# Patient Record
Sex: Female | Born: 1955 | Hispanic: No | Marital: Married | Smoking: Never smoker
Health system: Southern US, Community
[De-identification: ages and names within clinical notes are randomized; demographics above are authoritative.]

## PROBLEM LIST (undated history)

## (undated) DIAGNOSIS — E7800 Pure hypercholesterolemia, unspecified: Secondary | ICD-10-CM

## (undated) DIAGNOSIS — K219 Gastro-esophageal reflux disease without esophagitis: Secondary | ICD-10-CM

## (undated) DIAGNOSIS — Z8719 Personal history of other diseases of the digestive system: Secondary | ICD-10-CM

## (undated) DIAGNOSIS — Z8614 Personal history of Methicillin resistant Staphylococcus aureus infection: Secondary | ICD-10-CM

## (undated) DIAGNOSIS — R011 Cardiac murmur, unspecified: Secondary | ICD-10-CM

## (undated) HISTORY — PX: EYE SURGERY: SHX253

## (undated) HISTORY — PX: COLONOSCOPY: SHX174

---

## 2004-10-26 ENCOUNTER — Other Ambulatory Visit: Admission: RE | Admit: 2004-10-26 | Discharge: 2004-10-26 | Payer: Self-pay | Admitting: Dermatology

## 2007-01-03 DIAGNOSIS — Z8614 Personal history of Methicillin resistant Staphylococcus aureus infection: Secondary | ICD-10-CM

## 2007-01-03 HISTORY — DX: Personal history of Methicillin resistant Staphylococcus aureus infection: Z86.14

## 2017-01-23 ENCOUNTER — Other Ambulatory Visit: Payer: Self-pay | Admitting: Gastroenterology

## 2017-01-23 ENCOUNTER — Ambulatory Visit
Admission: RE | Admit: 2017-01-23 | Discharge: 2017-01-23 | Disposition: A | Discharge status: Home / Self Care | Payer: BLUE CROSS/BLUE SHIELD | Source: Ambulatory Visit | Attending: Gastroenterology | Admitting: Gastroenterology

## 2017-01-23 DIAGNOSIS — K6289 Other specified diseases of anus and rectum: Secondary | ICD-10-CM

## 2017-01-23 DIAGNOSIS — R1031 Right lower quadrant pain: Secondary | ICD-10-CM

## 2017-01-23 DIAGNOSIS — R1032 Left lower quadrant pain: Secondary | ICD-10-CM | POA: Diagnosis present

## 2017-01-25 ENCOUNTER — Ambulatory Visit
Admission: RE | Admit: 2017-01-25 | Discharge: 2017-01-25 | Disposition: A | Discharge status: Home / Self Care | Payer: BLUE CROSS/BLUE SHIELD | Source: Ambulatory Visit | Attending: Gastroenterology | Admitting: Gastroenterology

## 2017-01-31 ENCOUNTER — Ambulatory Visit: Admission: RE | Admit: 2017-01-31 | Payer: BLUE CROSS/BLUE SHIELD | Source: Ambulatory Visit

## 2017-02-05 ENCOUNTER — Encounter
Admission: RE | Admit: 2017-02-05 | Discharge: 2017-02-05 | Disposition: A | Discharge status: Home / Self Care | Payer: BLUE CROSS/BLUE SHIELD | Source: Ambulatory Visit | Attending: Obstetrics & Gynecology | Admitting: Obstetrics & Gynecology

## 2017-02-05 ENCOUNTER — Other Ambulatory Visit: Payer: Self-pay

## 2017-02-05 DIAGNOSIS — K219 Gastro-esophageal reflux disease without esophagitis: Secondary | ICD-10-CM | POA: Diagnosis not present

## 2017-02-05 DIAGNOSIS — Z8614 Personal history of Methicillin resistant Staphylococcus aureus infection: Secondary | ICD-10-CM | POA: Diagnosis not present

## 2017-02-05 DIAGNOSIS — Z803 Family history of malignant neoplasm of breast: Secondary | ICD-10-CM | POA: Diagnosis not present

## 2017-02-05 DIAGNOSIS — Z79899 Other long term (current) drug therapy: Secondary | ICD-10-CM | POA: Diagnosis not present

## 2017-02-05 DIAGNOSIS — R1900 Intra-abdominal and pelvic swelling, mass and lump, unspecified site: Secondary | ICD-10-CM | POA: Diagnosis present

## 2017-02-05 DIAGNOSIS — N736 Female pelvic peritoneal adhesions (postinfective): Secondary | ICD-10-CM | POA: Diagnosis not present

## 2017-02-05 DIAGNOSIS — Z79890 Hormone replacement therapy (postmenopausal): Secondary | ICD-10-CM | POA: Diagnosis not present

## 2017-02-05 DIAGNOSIS — Z800 Family history of malignant neoplasm of digestive organs: Secondary | ICD-10-CM | POA: Diagnosis not present

## 2017-02-05 DIAGNOSIS — Z801 Family history of malignant neoplasm of trachea, bronchus and lung: Secondary | ICD-10-CM | POA: Diagnosis not present

## 2017-02-05 DIAGNOSIS — Z01812 Encounter for preprocedural laboratory examination: Secondary | ICD-10-CM | POA: Diagnosis not present

## 2017-02-05 DIAGNOSIS — Z780 Asymptomatic menopausal state: Secondary | ICD-10-CM | POA: Diagnosis not present

## 2017-02-05 HISTORY — DX: Cardiac murmur, unspecified: R01.1

## 2017-02-05 HISTORY — DX: Personal history of other diseases of the digestive system: Z87.19

## 2017-02-05 HISTORY — DX: Gastro-esophageal reflux disease without esophagitis: K21.9

## 2017-02-05 HISTORY — DX: Personal history of Methicillin resistant Staphylococcus aureus infection: Z86.14

## 2017-02-05 LAB — SURGICAL PCR SCREEN
MRSA, PCR: NEGATIVE
STAPHYLOCOCCUS AUREUS: POSITIVE — AB

## 2017-02-05 NOTE — H&P (Signed)
Chief Complaint  Patient presents with  . Pelvic Mass   HPI: Allison Crane is a 62 y.o. G68P3003 female here for Pelvic Mass . She is new to our practice today, referred by: Niel Hummer presented to GI dept due to recent significant changes in her bowel habits in the last 6mos, and worseing in the last 6 weeks. She noted that she hadn't had a regular BM and all that was coming out was mucous. Prior to this she noted that she felt rectal pressure, that something was about to pop out, and grew increasingly painful. In the last few days, she resists eating solid foods, because the pain and pressure that ensues, she prefers just not to eat. She has lost 5# in as many days.  On exam and on subsequent imaging (ultrasound and CT) she was found to have a heterogeneous cystic and solid 8cm pelvic mass. It is unclear if it is arising from an ovary, as it is midline, or independent.  There is a history of colon cancer in a young sister (30s)  No known gyn cancers  Tumor markers:  Ca 125: 374 CEA: normal CA 19-9: 4394  Colonoscopy last on 08/2014 and negative. Had some abnormal paps "they froze it off" and after her 20's never had an abnormal.  No LMP recorded. Patient is postmenopausal.   She is on E/P HRT for menopausal sx.  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  Past Surgical History: has a past surgical history that includes Colonoscopy (09/10/2014) and Cesarean section. Family History: family history includes Breast cancer in her maternal grandmother; Colon cancer in her sister; Lung cancer in her maternal grandmother. Social History: reports that she has never smoked. She has never used smokeless tobacco. She reports that she does not drink alcohol or use drugs.   Allergies: is  allergic to sulfa (sulfonamide antibiotics). Medications:  Current Outpatient Medications:  . atorvastatin (LIPITOR) 10 MG tablet, Take 10 mg by mouth once daily, Disp: , Rfl:  . estradiol (ESTRACE) 2 MG tablet, Take 2 mg by mouth once daily., Disp: , Rfl:  . medroxyPROGESTERone (PROVERA) 2.5 MG tablet, Take 2.5 mg by mouth once daily., Disp: , Rfl:  . polyethylene glycol (MIRALAX) powder, Take 17 g by mouth once daily Mix in 4-8ounces of fluid prior to taking., Disp: 510 g, Rfl: 1  Review of Systems (positives per HPI): General: No fatigue or weight loss Eyes: No vision changes Ears: No hearing difficulty Respiratory: No cough or shortness of breath Cardiovascular: No chest pain, palpitations, dyspnea on exertion Gastrointestinal: As above. Genitourinary: No hematuria, dysuria, abnormal vaginal discharge, abnormal bleeding Lymphatic: No swollen lymph nodes Musculoskeletal: No muscle weakness Neurologic: No extremity weakness, syncope Psychiatric: No delusions or suicidal/homicidal ideation  Exam:  Constitutional: BP 159/77  Pulse 73  Ht 167.6 cm (5\' 6" )  Wt 69.9 kg (154 lb 3.2 oz)  BMI 24.89 kg/m   WDWN female in NAD  HEENT: sclera clear, non-icteric, moist mucous membranes, dentition intact Endocrine: no thyromegaly Respiratory: normal respiratory effort CTABL  CV: no peripheral edema RRR GI: soft , no mass, normal active bowel sounds, non-tender, no rebound tenderness GU: tanner stage 5 ,  External genitalia/skin: vulva /labia no lesions Lymphatic: no enlarged inguinal nodes bilaterally Urethra: no prolapse, no diverticulum, no caruncle Bladder:  no tenderness to palpation, no cystocele Vagina: normal physiologic d/c, no lesions, normal apical support Cervix: no lesions, no cervical motion tenderness  Uterus: ability to assess size and shape compromised by posterior mass. Anteverted or mass effect. Adnexa: no masses bilaterally, non-tender  Rectovaginal: posterior  mass, filling to the diagonal conjugate, dense and not easily moved, does not feel to be fixed but mobility is limited. , no nodularity Skin: warm and well perfused, no rashes Neuro: alert, oriented x3,  Psych: appropriate mood and insight, judgement intact Data: Reviewed: labs, results, imaging, referral records Imaging reports are printed And reviewed; CD of images to be uploaded then reviewed once in system.  The text of the radiology reports are not able to be transferred directly to this note.   CT shows no lymphadenopathy, no curious lesions in pancreas, liver, omental caking, or other pelvic process. Mass effect on ureter but no hydronephrosis. Ultrasound confirms complex mass, with solid and cystic components.   Impression:   The encounter diagnosis was Pelvic mass.  Plan:   Ms. Allison Crane is a generally healthy person, with a new finding of a pelvic mass without radiographic findings of disease throughout the abdomen. Due to the unknown nature of the mass, the recommended approach to diagnosis and treatment is surgical.  My recommendation is expedited surgical exploration with proposed removal of the mass, and appropriate concurrent surgeries pending its origin.  Due to the lack of intraabdominal findings on imaging, a minimally invasive approach is reasonable to attempt. She understands that my suspicion of cancer is present, and prominent, and that it may be necessary to convert to an open procedure for adequate manipulation of the mass and/or exploration of other structures.  She was also advised that if this were a benign ovarian tumor, that removal of the uterus would not be necessary, however she elected to proceed with a single surgery to remove all of her reproductive organs: uterus, cervix, tubes and ovaries.  The patient and I discussed the technical aspects of the procedure including the potential for risks and complications.These include but are not limited to the risk of  infection requiring post-operative antibiotics or further procedures.We talked about the risk of injury to adjacent organs including bladder, bowel, ureter, blood vessels or nerves, the need to convert to an open incision, possibleneed for blood transfusion andpostop complications such asthromboembolic or cardiopulmonary complications.All of her questions were answered. Her preoperative exam was completed andthe appropriate consents were signed. She is scheduled to undergo this procedure in the near future.  Proposed surgery: Total laparoscopic hysterectomy, bilateral salpingo-oophorectomy, removal of pelvic mass, and possible laparotomy, possible pelvic and para-aortic lymphadenectomy.  I have been in contact with Dr. Johnnette LitterBerchuck regarding assisting me with this surgery and he is willing. She is tentatively scheduled for Feb 6, next Wednesday.  CHELSEA CRIST WARD, MD

## 2017-02-05 NOTE — Patient Instructions (Signed)
  [facility] 2nd floor medical mall [facility](Medical Mall Entrance-take elevator on left to 2nd floor.  Check in with surgery information desk.) To find out your arrival time please call [phone] between 1PM - 3PM on 02-06-17 TUESDAY  Remember: Instructions that are not followed completely may result in serious medical risk, up to and including death, or upon the discretion of your surgeon and anesthesiologist your surgery may need to be rescheduled.    _x___ 1. Do not eat food after midnight the night before your procedure. NO GUM OR CANDY AFTER MIDNIGHT.  You may drink clear liquids up to 2 hours before you are scheduled to arrive at the hospital for your procedure.  Do not drink clear liquids within 2 hours of your scheduled arrival to the hospital.  Clear liquids include  --Water or Apple juice without pulp  --Clear carbohydrate beverage such as ClearFast or Gatorade  --Black Coffee or Clear Tea (No milk, no creamers, do not add anything to the coffee or Tea     __x__ 2. No Alcohol for 24 hours before or after surgery.   __x__3. No Smoking for 24 prior to surgery.   ____  4. Bring all medications with you on the day of surgery if instructed.    __x__ 5. Notify your doctor if there is any change in your medical condition     (cold, fever, infections).     Do not wear jewelry, make-up, hairpins, clips or nail polish.  Do not wear lotions, powders, or perfumes. You may wear deodorant.  Do not shave 48 hours prior to surgery. Men may shave face and neck.  Do not bring valuables to the hospital.    [facility] not [facility] any belongings or valuables.               Contacts, dentures or bridgework may not be worn into surgery.  Leave your suitcase in the car. After surgery it may be brought to your room.  For patients admitted to the hospital, discharge time is determined by your treatment team.   Patients  discharged the day of surgery will not be allowed to drive home.  You will need someone to drive you home and stay with you the night of your procedure.    Please read over the following fact sheets that you were given:   [facility] Pre[facility] and or MRSA Information   _x___ TAKE THE FOLLOWING MEDICATION THE MORNING OF SURGERY WITH A SMALL SIP OF WATER. These include:  1. LIPITOR (ATROVASTATIN)  2.  3.  4.  5.  6.  ____Fleets enema or Magnesium Citrate as directed.   _x___ Use CHG Soap or sage wipes as directed on instruction sheet   ____ Use inhalers on the day of surgery and bring to hospital day of surgery  ____ Stop Metformin and Janumet 2 days prior to surgery.    ____ Take 1/2 of usual insulin dose the night before surgery and none on the morning surgery.   ____ Follow recommendations from Cardiologist, Pulmonologist or PCP regarding  stopping Aspirin, Coumadin, Plavix ,Eliquis, Effient, or Pradaxa, and Pletal.  X____Stop Anti-inflammatories such as Advil, Aleve, Ibuprofen, Motrin, Naproxen, Naprosyn, Goodies powders or aspirin products NOW-OK to take Tylenol    ____ Stop supplements until after surgery.   ____ Bring C-Pap to the hospital.

## 2017-02-06 ENCOUNTER — Encounter: Payer: Self-pay | Admitting: *Deleted

## 2017-02-06 MED ORDER — CEFAZOLIN SODIUM-DEXTROSE 2-4 GM/100ML-% IV SOLN
2.0000 g | INTRAVENOUS | Status: AC
  Administered 2017-02-07: 2 g via INTRAVENOUS

## 2017-02-07 ENCOUNTER — Encounter: Payer: Self-pay | Admitting: *Deleted

## 2017-02-07 ENCOUNTER — Encounter
Admission: RE | Disposition: A | Discharge status: Home / Self Care | Payer: Self-pay | Source: Ambulatory Visit | Attending: Obstetrics & Gynecology

## 2017-02-07 ENCOUNTER — Ambulatory Visit
Admission: RE | Admit: 2017-02-07 | Discharge: 2017-02-07 | Disposition: A | Discharge status: Home / Self Care | Payer: BLUE CROSS/BLUE SHIELD | Source: Ambulatory Visit | Attending: Obstetrics & Gynecology | Admitting: Obstetrics & Gynecology

## 2017-02-07 ENCOUNTER — Inpatient Hospital Stay: Payer: BLUE CROSS/BLUE SHIELD | Admitting: Anesthesiology

## 2017-02-07 DIAGNOSIS — Z79890 Hormone replacement therapy (postmenopausal): Secondary | ICD-10-CM | POA: Insufficient documentation

## 2017-02-07 DIAGNOSIS — Z79899 Other long term (current) drug therapy: Secondary | ICD-10-CM | POA: Insufficient documentation

## 2017-02-07 DIAGNOSIS — N736 Female pelvic peritoneal adhesions (postinfective): Secondary | ICD-10-CM | POA: Insufficient documentation

## 2017-02-07 DIAGNOSIS — Z8614 Personal history of Methicillin resistant Staphylococcus aureus infection: Secondary | ICD-10-CM | POA: Insufficient documentation

## 2017-02-07 DIAGNOSIS — Z780 Asymptomatic menopausal state: Secondary | ICD-10-CM | POA: Insufficient documentation

## 2017-02-07 DIAGNOSIS — Z801 Family history of malignant neoplasm of trachea, bronchus and lung: Secondary | ICD-10-CM | POA: Insufficient documentation

## 2017-02-07 DIAGNOSIS — K219 Gastro-esophageal reflux disease without esophagitis: Secondary | ICD-10-CM | POA: Insufficient documentation

## 2017-02-07 DIAGNOSIS — Z800 Family history of malignant neoplasm of digestive organs: Secondary | ICD-10-CM | POA: Insufficient documentation

## 2017-02-07 DIAGNOSIS — R1900 Intra-abdominal and pelvic swelling, mass and lump, unspecified site: Secondary | ICD-10-CM | POA: Diagnosis not present

## 2017-02-07 DIAGNOSIS — Z803 Family history of malignant neoplasm of breast: Secondary | ICD-10-CM | POA: Insufficient documentation

## 2017-02-07 DIAGNOSIS — Z01812 Encounter for preprocedural laboratory examination: Secondary | ICD-10-CM | POA: Insufficient documentation

## 2017-02-07 HISTORY — PX: LAPAROSCOPIC BILATERAL SALPINGECTOMY: SHX5889

## 2017-02-07 HISTORY — PX: LAPAROSCOPIC HYSTERECTOMY: SHX1926

## 2017-02-07 HISTORY — DX: Pure hypercholesterolemia, unspecified: E78.00

## 2017-02-07 HISTORY — PX: LAPAROTOMY: SHX154

## 2017-02-07 LAB — ABO/RH: ABO/RH(D): AB POS

## 2017-02-07 SURGERY — HYSTERECTOMY, TOTAL, LAPAROSCOPIC
Anesthesia: General

## 2017-02-07 MED ORDER — ROCURONIUM BROMIDE 100 MG/10ML IV SOLN
INTRAVENOUS | Status: DC | PRN
  Administered 2017-02-07: 50 mg via INTRAVENOUS

## 2017-02-07 MED ORDER — FAMOTIDINE 20 MG PO TABS
ORAL_TABLET | ORAL | Status: AC
  Administered 2017-02-07: 20 mg via ORAL
  Filled 2017-02-07: qty 1

## 2017-02-07 MED ORDER — OXYCODONE HCL 5 MG/5ML PO SOLN: 5.0000 mg | Freq: Once | ORAL | Status: DC | PRN

## 2017-02-07 MED ORDER — CEFAZOLIN SODIUM-DEXTROSE 2-4 GM/100ML-% IV SOLN
INTRAVENOUS | Status: AC
Start: 2017-02-07 — End: 2017-02-07
  Filled 2017-02-07: qty 100

## 2017-02-07 MED ORDER — FENTANYL CITRATE (PF) 100 MCG/2ML IJ SOLN
INTRAMUSCULAR | Status: DC | PRN
  Administered 2017-02-07 (×4): 50 ug via INTRAVENOUS

## 2017-02-07 MED ORDER — KETOROLAC TROMETHAMINE 30 MG/ML IJ SOLN
30.0000 mg | Freq: Four times a day (QID) | INTRAMUSCULAR | Status: DC
  Administered 2017-02-07: 30 mg via INTRAVENOUS
  Filled 2017-02-07: qty 1

## 2017-02-07 MED ORDER — SCOPOLAMINE 1 MG/3DAYS TD PT72
MEDICATED_PATCH | TRANSDERMAL | Status: AC
  Administered 2017-02-07: 1.5 mg via TRANSDERMAL
  Filled 2017-02-07: qty 1

## 2017-02-07 MED ORDER — DEXAMETHASONE SODIUM PHOSPHATE 10 MG/ML IJ SOLN
INTRAMUSCULAR | Status: AC
  Filled 2017-02-07: qty 1

## 2017-02-07 MED ORDER — MIDAZOLAM HCL 2 MG/2ML IJ SOLN
INTRAMUSCULAR | Status: AC
  Filled 2017-02-07: qty 2

## 2017-02-07 MED ORDER — ROCURONIUM BROMIDE 50 MG/5ML IV SOLN
INTRAVENOUS | Status: AC
  Filled 2017-02-07: qty 1

## 2017-02-07 MED ORDER — LIDOCAINE HCL (CARDIAC) 20 MG/ML IV SOLN
INTRAVENOUS | Status: DC | PRN
  Administered 2017-02-07: 100 mg via INTRAVENOUS

## 2017-02-07 MED ORDER — LACTATED RINGERS IV SOLN
INTRAVENOUS | Status: DC
  Administered 2017-02-07 (×2): via INTRAVENOUS

## 2017-02-07 MED ORDER — MIDAZOLAM HCL 2 MG/2ML IJ SOLN
INTRAMUSCULAR | Status: DC | PRN
  Administered 2017-02-07: 2 mg via INTRAVENOUS

## 2017-02-07 MED ORDER — SUGAMMADEX SODIUM 200 MG/2ML IV SOLN
INTRAVENOUS | Status: AC
  Filled 2017-02-07: qty 2

## 2017-02-07 MED ORDER — MEPERIDINE HCL 50 MG/ML IJ SOLN: 6.2500 mg | INTRAMUSCULAR | Status: DC | PRN

## 2017-02-07 MED ORDER — IBUPROFEN 800 MG PO TABS: 800.0000 mg | ORAL_TABLET | Freq: Four times a day (QID) | ORAL | 0 refills | Status: AC

## 2017-02-07 MED ORDER — ONDANSETRON HCL 4 MG/2ML IJ SOLN
INTRAMUSCULAR | Status: DC | PRN
  Administered 2017-02-07: 4 mg via INTRAVENOUS

## 2017-02-07 MED ORDER — PROMETHAZINE HCL 25 MG/ML IJ SOLN
6.2500 mg | INTRAMUSCULAR | Status: DC | PRN
  Administered 2017-02-07: 6.25 mg via INTRAVENOUS

## 2017-02-07 MED ORDER — DEXAMETHASONE SODIUM PHOSPHATE 10 MG/ML IJ SOLN
INTRAMUSCULAR | Status: DC | PRN
  Administered 2017-02-07: 5 mg via INTRAVENOUS

## 2017-02-07 MED ORDER — GABAPENTIN 300 MG PO CAPS
ORAL_CAPSULE | ORAL | Status: AC
  Administered 2017-02-07: 600 mg via ORAL
  Filled 2017-02-07: qty 2

## 2017-02-07 MED ORDER — ENOXAPARIN SODIUM 40 MG/0.4ML ~~LOC~~ SOLN
40.0000 mg | SUBCUTANEOUS | Status: AC
  Administered 2017-02-07: 40 mg via SUBCUTANEOUS
  Filled 2017-02-07: qty 0.4

## 2017-02-07 MED ORDER — CELECOXIB 200 MG PO CAPS
ORAL_CAPSULE | ORAL | Status: AC
  Administered 2017-02-07: 400 mg via ORAL
  Filled 2017-02-07: qty 2

## 2017-02-07 MED ORDER — FENTANYL CITRATE (PF) 100 MCG/2ML IJ SOLN
INTRAMUSCULAR | Status: AC
  Filled 2017-02-07: qty 2

## 2017-02-07 MED ORDER — PROPOFOL 10 MG/ML IV BOLUS
INTRAVENOUS | Status: DC | PRN
  Administered 2017-02-07: 180 mg via INTRAVENOUS

## 2017-02-07 MED ORDER — KETOROLAC TROMETHAMINE 30 MG/ML IJ SOLN
INTRAMUSCULAR | Status: AC
  Administered 2017-02-07: 30 mg via INTRAVENOUS
  Filled 2017-02-07: qty 1

## 2017-02-07 MED ORDER — ACETAMINOPHEN 500 MG PO TABS
1000.0000 mg | ORAL_TABLET | ORAL | Status: AC
  Administered 2017-02-07: 1000 mg via ORAL

## 2017-02-07 MED ORDER — ACETAMINOPHEN 500 MG PO TABS
ORAL_TABLET | ORAL | Status: AC
  Administered 2017-02-07: 1000 mg via ORAL
  Filled 2017-02-07: qty 2

## 2017-02-07 MED ORDER — PROPOFOL 10 MG/ML IV BOLUS
INTRAVENOUS | Status: AC
  Filled 2017-02-07: qty 20

## 2017-02-07 MED ORDER — GABAPENTIN 300 MG PO CAPS
600.0000 mg | ORAL_CAPSULE | ORAL | Status: AC
  Administered 2017-02-07: 600 mg via ORAL

## 2017-02-07 MED ORDER — LIDOCAINE HCL (PF) 2 % IJ SOLN
INTRAMUSCULAR | Status: AC
  Filled 2017-02-07: qty 10

## 2017-02-07 MED ORDER — PHENYLEPHRINE HCL 10 MG/ML IJ SOLN
INTRAMUSCULAR | Status: DC | PRN
  Administered 2017-02-07: 50 ug via INTRAVENOUS

## 2017-02-07 MED ORDER — FAMOTIDINE 20 MG PO TABS
20.0000 mg | ORAL_TABLET | Freq: Once | ORAL | Status: AC
  Administered 2017-02-07: 20 mg via ORAL

## 2017-02-07 MED ORDER — OXYCODONE HCL 5 MG PO TABS: 5.0000 mg | ORAL_TABLET | Freq: Once | ORAL | Status: DC | PRN

## 2017-02-07 MED ORDER — SUGAMMADEX SODIUM 200 MG/2ML IV SOLN
INTRAVENOUS | Status: DC | PRN
  Administered 2017-02-07: 200 mg via INTRAVENOUS

## 2017-02-07 MED ORDER — ONDANSETRON HCL 4 MG/2ML IJ SOLN
INTRAMUSCULAR | Status: AC
  Filled 2017-02-07: qty 2

## 2017-02-07 MED ORDER — SCOPOLAMINE 1 MG/3DAYS TD PT72
1.0000 | MEDICATED_PATCH | TRANSDERMAL | Status: DC
  Administered 2017-02-07: 1.5 mg via TRANSDERMAL

## 2017-02-07 MED ORDER — FENTANYL CITRATE (PF) 100 MCG/2ML IJ SOLN
INTRAMUSCULAR | Status: AC
  Administered 2017-02-07: 25 ug via INTRAVENOUS
  Filled 2017-02-07: qty 2

## 2017-02-07 MED ORDER — PROMETHAZINE HCL 25 MG/ML IJ SOLN
INTRAMUSCULAR | Status: AC
  Administered 2017-02-07: 6.25 mg via INTRAVENOUS
  Filled 2017-02-07: qty 1

## 2017-02-07 MED ORDER — OXYCODONE HCL 5 MG PO TABS: 5.0000 mg | ORAL_TABLET | ORAL | 0 refills | Status: AC | PRN

## 2017-02-07 MED ORDER — CELECOXIB 200 MG PO CAPS
400.0000 mg | ORAL_CAPSULE | ORAL | Status: AC
  Administered 2017-02-07: 400 mg via ORAL

## 2017-02-07 MED ORDER — FENTANYL CITRATE (PF) 100 MCG/2ML IJ SOLN
25.0000 ug | INTRAMUSCULAR | Status: DC | PRN
  Administered 2017-02-07 (×2): 25 ug via INTRAVENOUS

## 2017-02-07 MED ORDER — DEXAMETHASONE SODIUM PHOSPHATE 4 MG/ML IJ SOLN
4.0000 mg | INTRAMUSCULAR | Status: AC
  Administered 2017-02-07: 4 mg via INTRAVENOUS
  Filled 2017-02-07: qty 1

## 2017-02-07 MED ORDER — SODIUM CHLORIDE 0.9 % IJ SOLN
INTRAMUSCULAR | Status: AC
  Filled 2017-02-07: qty 10

## 2017-02-07 SURGICAL SUPPLY — 97 items
ADH SKN CLS APL DERMABOND .7 (GAUZE/BANDAGES/DRESSINGS) ×2
AGENT HMST MTR 8 SURGIFLO (HEMOSTASIS)
APL ESCP 34 STRL LF DISP (HEMOSTASIS)
APPLICATOR SURGIFLO ENDO (HEMOSTASIS) IMPLANT
BAG SPEC RTRVL LRG 6X4 10 (ENDOMECHANICALS) ×2
BAG URINE DRAINAGE (UROLOGICAL SUPPLIES) ×4 IMPLANT
BLADE SURG 15 STRL LF DISP TIS (BLADE) ×2 IMPLANT
BLADE SURG 15 STRL SS (BLADE) ×4
CANISTER SUCT 1200ML W/VALVE (MISCELLANEOUS) ×4 IMPLANT
CANNULA DILATOR  5MM W/SLV (CANNULA) ×1
CANNULA DILATOR 5 W/SLV (CANNULA) ×3 IMPLANT
CATH FOLEY 2WAY  5CC 16FR (CATHETERS) ×2
CATH FOLEY 2WAY 5CC 16FR (CATHETERS) ×2
CATH URTH 16FR FL 2W BLN LF (CATHETERS) ×2 IMPLANT
CHLORAPREP W/TINT 26ML (MISCELLANEOUS) ×4 IMPLANT
CNTNR SPEC 2.5X3XGRAD LEK (MISCELLANEOUS)
CONT SPEC 4OZ STER OR WHT (MISCELLANEOUS)
CONT SPEC 4OZ STRL OR WHT (MISCELLANEOUS)
CONTAINER SPEC 2.5X3XGRAD LEK (MISCELLANEOUS) ×2 IMPLANT
CORD MONOPOLAR M/FML 12FT (MISCELLANEOUS) ×4 IMPLANT
DEFOGGER SCOPE WARMER CLEARIFY (MISCELLANEOUS) ×4 IMPLANT
DERMABOND ADVANCED (GAUZE/BANDAGES/DRESSINGS) ×2
DERMABOND ADVANCED .7 DNX12 (GAUZE/BANDAGES/DRESSINGS) ×2 IMPLANT
DEVICE PMI PUNCTURE CLOSURE (MISCELLANEOUS) ×2 IMPLANT
DEVICE SUTURE ENDOST 10MM (ENDOMECHANICALS) ×4 IMPLANT
DRAPE LAP W/FLUID (DRAPES) ×4 IMPLANT
DRAPE LAPAROTOMY 100X77 ABD (DRAPES) ×2 IMPLANT
DRAPE LAPAROTOMY TRNSV 106X77 (MISCELLANEOUS) ×2 IMPLANT
DRAPE PERI LITHO V/GYN (MISCELLANEOUS) ×2 IMPLANT
DRAPE STERI POUCH LG 24X46 STR (DRAPES) ×8 IMPLANT
DRAPE UNDER BUTTOCK W/FLU (DRAPES) ×4 IMPLANT
DRSG TELFA 3X8 NADH (GAUZE/BANDAGES/DRESSINGS) IMPLANT
ELECT BLADE 6 FLAT ULTRCLN (ELECTRODE) ×2 IMPLANT
ELECT CAUTERY BLADE 6.4 (BLADE) ×2 IMPLANT
ELECT REM PT RETURN 9FT ADLT (ELECTROSURGICAL) ×4
ELECTRODE REM PT RTRN 9FT ADLT (ELECTROSURGICAL) ×2 IMPLANT
GAUZE SPONGE 4X4 12PLY STRL (GAUZE/BANDAGES/DRESSINGS) ×2 IMPLANT
GLOVE BIO SURGEON STRL SZ8 (GLOVE) ×18 IMPLANT
GLOVE INDICATOR 8.0 STRL GRN (GLOVE) ×12 IMPLANT
GOWN STRL REUS W/ TWL LRG LVL3 (GOWN DISPOSABLE) ×4 IMPLANT
GOWN STRL REUS W/ TWL XL LVL3 (GOWN DISPOSABLE) ×2 IMPLANT
GOWN STRL REUS W/TWL LRG LVL3 (GOWN DISPOSABLE) ×8
GOWN STRL REUS W/TWL XL LVL3 (GOWN DISPOSABLE) ×4
GRASPER ENDO ROTC 5X36 (INSTRUMENTS) IMPLANT
IRRIGATION STRYKERFLOW (MISCELLANEOUS) IMPLANT
IRRIGATOR STRYKERFLOW (MISCELLANEOUS) ×4
IV LACTATED RINGERS 1000ML (IV SOLUTION) ×4 IMPLANT
KIT PINK PAD W/HEAD ARE REST (MISCELLANEOUS) ×4
KIT PINK PAD W/HEAD ARM REST (MISCELLANEOUS) ×2 IMPLANT
KIT TURNOVER CYSTO (KITS) ×4 IMPLANT
LABEL OR SOLS (LABEL) ×2 IMPLANT
LIGASURE IMPACT 36 18CM CVD LR (INSTRUMENTS) ×2 IMPLANT
LIGASURE VESSEL 5MM BLUNT TIP (ELECTROSURGICAL) ×4 IMPLANT
MANIPULATOR VCARE LG CRV RETR (MISCELLANEOUS) IMPLANT
MANIPULATOR VCARE SML CRV RETR (MISCELLANEOUS) IMPLANT
MANIPULATOR VCARE STD CRV RETR (MISCELLANEOUS) ×2 IMPLANT
NDL INSUFF ACCESS 14 VERSASTEP (NEEDLE) ×4 IMPLANT
NDL SPNL 22GX5 LNG QUINC BK (NEEDLE) ×2 IMPLANT
NEEDLE SPNL 22GX5 LNG QUINC BK (NEEDLE) ×4 IMPLANT
NS IRRIG 1000ML POUR BTL (IV SOLUTION) ×2 IMPLANT
NS IRRIG 500ML POUR BTL (IV SOLUTION) ×4 IMPLANT
OCCLUDER COLPOPNEUMO (BALLOONS) ×4 IMPLANT
PACK BASIN MAJOR ARMC (MISCELLANEOUS) ×2 IMPLANT
PACK GYN LAPAROSCOPIC (MISCELLANEOUS) ×4 IMPLANT
PAD DRESSING TELFA 3X8 NADH (GAUZE/BANDAGES/DRESSINGS) ×2 IMPLANT
PAD OB MATERNITY 4.3X12.25 (PERSONAL CARE ITEMS) ×4 IMPLANT
PAD PREP 24X41 OB/GYN DISP (PERSONAL CARE ITEMS) ×4 IMPLANT
POUCH ENDO CATCH II 15MM (MISCELLANEOUS) IMPLANT
POUCH SPECIMEN RETRIEVAL 10MM (ENDOMECHANICALS) ×2 IMPLANT
SET CYSTO W/LG BORE CLAMP LF (SET/KITS/TRAYS/PACK) ×4 IMPLANT
SHEARS ENDO 5MM 31CM (CUTTER) ×2 IMPLANT
SPOGE SURGIFLO 8M (HEMOSTASIS)
SPONGE LAP 18X18 5 PK (GAUZE/BANDAGES/DRESSINGS) ×2 IMPLANT
SPONGE LAP 4X18 5PK (MISCELLANEOUS) IMPLANT
SPONGE SURGIFLO 8M (HEMOSTASIS) IMPLANT
STAPLER SKIN PROX 35W (STAPLE) ×2 IMPLANT
SUT ENDO VLOC 180-0-8IN (SUTURE) ×4 IMPLANT
SUT MAXON ABS #0 GS21 30IN (SUTURE) ×4 IMPLANT
SUT MNCRL 4-0 (SUTURE) ×8
SUT MNCRL 4-0 27XMFL (SUTURE) ×4
SUT PDS AB 1 TP1 96 (SUTURE) ×4 IMPLANT
SUT PLAIN 2 0 XLH (SUTURE) ×2 IMPLANT
SUT VIC AB 0 CT1 27 (SUTURE)
SUT VIC AB 0 CT1 27XCR 8 STRN (SUTURE) ×8 IMPLANT
SUT VIC AB 0 CT1 36 (SUTURE) ×8 IMPLANT
SUT VIC AB 2-0 UR6 27 (SUTURE) ×6 IMPLANT
SUT VICRYL AB 3-0 FS1 BRD 27IN (SUTURE) ×2 IMPLANT
SUTURE MNCRL 4-0 27XMF (SUTURE) IMPLANT
SYR 10ML LL (SYRINGE) ×4 IMPLANT
SYR 3ML LL SCALE MARK (SYRINGE) ×8 IMPLANT
SYR 50ML LL SCALE MARK (SYRINGE) ×8 IMPLANT
SYR BULB IRRIG 60ML STRL (SYRINGE) ×2 IMPLANT
TRAY FOLEY W/METER SILVER 16FR (SET/KITS/TRAYS/PACK) ×2 IMPLANT
TROCAR BLUNT TIP 12MM OMST12BT (TROCAR) ×4 IMPLANT
TROCAR VERSASTEP PLUS 12MM (TROCAR) ×4 IMPLANT
TROCAR VERSASTEP PLUS 5MM (TROCAR) ×8 IMPLANT
TUBING INSUF HEATED (TUBING) ×4 IMPLANT

## 2017-02-07 NOTE — Transfer of Care (Signed)
Immediate Anesthesia Transfer of Care Note  Patient: Allison Crane  Procedure(s) Performed: HYSTERECTOMY TOTAL LAPAROSCOPIC (N/A ) LAPAROSCOPIC BILATERAL SALPINGECTOMY (Bilateral ) LAPAROTOMY (N/A )  Patient Location: PACU  Anesthesia Type:General  Level of Consciousness: sedated  Airway & Oxygen Therapy: Patient Spontanous Breathing  Post-op Assessment: Report given to RN  Post vital signs: Reviewed and stable  Last Vitals:  Vitals:   02/07/17 0703  BP: (!) 152/67  Pulse: 60  Resp: 18  Temp: (!) 36.2 C  SpO2: 100%    Last Pain:  Vitals:   02/07/17 0703  TempSrc: Tympanic      Patients Stated Pain Goal: 0 (02/07/17 0703)  Complications: No apparent anesthesia complications

## 2017-02-07 NOTE — Anesthesia Postprocedure Evaluation (Signed)
Anesthesia Post Note  Patient: Audree CamelKathy Cavazos  Procedure(s) Performed: HYSTERECTOMY TOTAL LAPAROSCOPIC (N/A ) LAPAROSCOPIC BILATERAL SALPINGECTOMY (Bilateral ) LAPAROTOMY (N/A )  Patient location during evaluation: PACU Anesthesia Type: General Level of consciousness: awake and alert and oriented Pain management: pain level controlled Vital Signs Assessment: post-procedure vital signs reviewed and stable Respiratory status: spontaneous breathing, nonlabored ventilation and respiratory function stable Cardiovascular status: blood pressure returned to baseline and stable Postop Assessment: no signs of nausea or vomiting Anesthetic complications: no     Last Vitals:  Vitals:   02/07/17 1153 02/07/17 1217  BP:  130/61  Pulse: 77 87  Resp: 19 18  Temp: 36.6 C 36.6 C  SpO2: 95% 96%    Last Pain:  Vitals:   02/07/17 1217  TempSrc: Temporal  PainSc: 6                  Angeli Demilio

## 2017-02-07 NOTE — Anesthesia Procedure Notes (Signed)
Performed by: Ermine Spofford E, CRNA       

## 2017-02-07 NOTE — Interval H&P Note (Signed)
History and Physical Interval Note:  02/07/2017 7:52 AM  Allison Crane  has presented today for surgery, with the diagnosis of pelvic mass  The various methods of treatment have been discussed with the patient and family. After consideration of risks, benefits and other options for treatment, the patient has consented to  Procedure(s): HYSTERECTOMY TOTAL LAPAROSCOPIC (N/A) LAPAROSCOPIC BILATERAL SALPINGECTOMY (Bilateral) possible laparotomy, possible pelvic and para-aortic lymphadenectomy, as a surgical intervention .  The patient's history has been reviewed, patient examined, no change in status, stable for surgery.  I have reviewed the patient's chart and labs.  Questions were answered to the patient's satisfaction.    BP (!) 152/67   Pulse 60   Temp (!) 97.2 F (36.2 C) (Tympanic)   Resp 18   Ht 5\' 6"  (1.676 m)   Wt 69.9 kg (154 lb)   LMP 02/05/2014   SpO2 100%   BMI 24.86 kg/m    [facility] Ward

## 2017-02-07 NOTE — Anesthesia Procedure Notes (Signed)
Procedure Name: Intubation Date/Time: 02/07/2017 8:08 AM Performed by: Danelle BerryWarr, Shunda Rabadi E, CRNA Pre-anesthesia Checklist: Patient identified, Emergency Drugs available, Suction available, Patient being monitored and Timeout performed Patient Re-evaluated:Patient Re-evaluated prior to induction Oxygen Delivery Method: Circle system utilized and Simple face mask Preoxygenation: Pre-oxygenation with 100% oxygen Induction Type: IV induction Ventilation: Mask ventilation without difficulty Laryngoscope Size: McGraph and 3 Grade View: Grade III Tube type: Oral Tube size: 7.0 mm Number of attempts: 2 Airway Equipment and Method: Stylet and Video-laryngoscopy Placement Confirmation: ETT inserted through vocal cords under direct vision Secured at: 20 cm Tube secured with: Tape Dental Injury: Teeth and Oropharynx as per pre-operative assessment  Difficulty Due To: Difficulty was unanticipated

## 2017-02-07 NOTE — Op Note (Signed)
Total Laparoscopic Hysterectomy Operative Note Procedure Date: 02/07/2017  Patient:  Allison Crane  62 y.o. female  PRE-OPERATIVE DIAGNOSIS:  pelvic mass  POST-OPERATIVE DIAGNOSIS:  Pelvic inclusion cyst  PROCEDURE:  Procedure(s): HYSTERECTOMY TOTAL LAPAROSCOPIC (N/A) LAPAROSCOPIC BILATERAL SALPINGECTOMY (Bilateral) Extensive lysis of adhesions (>26mins) Proctoscopy, sigmoidoscopy  SURGEON:  Surgeon(s) and Role:    * Neila Teem, Elenora Fender, MD - Primary Assisted by Dr. Leida Lauth  ANESTHESIA:  General via ET  I/O  Total I/O In: 1500 [I.V.:1500] Out: 300 [Urine:200; Blood:100]  FINDINGS:  Small uterus, scarred and edematous bilateral adnexa, normal right ovary, left ovary not visualized.  Bilateral vermiculating ureters. Omental adhesion to anterior abdominal wall from umbilicus to pelvic brim.  Normal liver and diaphragm.   Adhesions of the omentum to right fundus of uterus Adhesion of proximal and redundant sigmoid colon to the posterior culdesac, with a rind of tissue adherent to both the colon and the peritoneum.   Superior to the sigmoid/rectum and inferior to the right adnexa/posterior broad ligament was a gray fibrous collection of adherent tissue which was able to be ligated and peeled off and sent for frozen pathology along with uterus, cervix, tubes and ovaries.  SPECIMEN: Uterus, Cervix, and bilateral tubes and ovaries, posterior culdesac mass. Pelvic washings.  FROZEN PATHOLOGY: benign   COMPLICATIONS: none apparent  DISPOSITION: vital signs stable to PACU  Indication for Surgery: 62 y.o. G3P3 with recent onset change in bowel habits, with CT and Ultrasound showing 8cm pelvic mass with solid and cystic components; CA 125 374 and CA 19-9 4394, CEA normal.  Risks of surgery were discussed with the patient including but not limited to: bleeding which may require transfusion or reoperation; infection which may require antibiotics; injury to bowel, bladder, ureters or other  surrounding organs; need for additional procedures including laparotomy, blood clot, incisional problems and other postoperative/anesthesia complications. Written informed consent was obtained.     PROCEDURE IN DETAIL:  The patient had 40mg  Lovenox SQ and sequential compression devices applied to her lower extremities while in the preoperative area.  She was then taken to the operating room. IV antibiotics were given. General anesthesia was administered via endotracheal route.  She was placed in the dorsal lithotomy position, and was prepped and draped in a sterile manner. A surgical time-out was performed.  A Foley catheter was inserted into her bladder and attached to constant drainage and a V-Care uterine manipulator was then advanced into the uterus and a good fit around the cervix was noted. The gloves were changed, and attention was turned to the abdomen where an umbilical incision was made with the scalpel.  An open Roseanne Reno technique was used to develop the incision through the fascia, and a 15mm balloon trochar was inserted in the umbilical incision.Opening pressure was , and the abdomen was insufflated to carbon dioxide gas and adequate pneumoperitoneum was obtained. A survey of the patient's pelvis and abdomen revealed the findings as mentioned above. Two 5mm ports were inserted in the lower left and right quadrants under visualization, and the endoshears were used to transect the omental adhesion from the anterior abdominal wall. A fourth, 11mm trochar was inserted suprapubically.     Pelvic washings were obtained.  The omental adhesion at the fundus of the uterus was transected.  The Sigmoid adhesion to the posterior culdesac was both bluntly and sharply dissected from the peritoneal surface, leaving behind a rind of tissue on both surfaces and without apparent injury to the sigmoid.  The bilateral round  ligaments were transected.  The bilateral peritoneum along the IP ligaments were  divided.  The bilateral ureters were visualized vermiculating and away from the IP ligaments.  The bilateral IP ligaments were thrice cauterized and transected.  The anterior broad ligament divided and brought across the uterus to separate the vesicouterine peritoneum and create a bladder flap.  The bladder was pushed away from the uterus. The bilateral uterine arteries were skeletonized, ligated and transected. The bilateral uterosacral and cardinal ligaments were ligated and transected. A colpotomy was made around the V-Care cervical cup and the uterus, cervix, bilateral tubes and ovaries were removed through the vagina. The vaginal cuff was closed vaginally using V-Lock suture with the Endostitch. The pneumoperitoneum was deflated and recreated and surgical site inspected, and found to be hemostatic. Bilateral ureters were visualized vermiuclating. No intraoperative injury to surrounding organs was noted.   The rigid sigmoidoscope was inserted into the anus and through the rectum.  Suction was applied to allow visualization.  There was no apparent diverticulum nor injury to the mucosa of the sigmoid.  The abdomen was filled with fluid and a bubble test was negative.   Each of the four trochar sites were closed with 2-0 vicryl with the inlet closure device.  The abdomen was desufflated and all instruments were then removed. All skin incisions were closed with 4-0 monocryl and covered with surgical glue. The foley catheter was removed.  The patient tolerated the procedures well.  All instruments, needles, and sponge counts were correct x 2. The patient was taken to the recovery room in stable condition.   ---- Ranae Plumberhelsea Brantley Naser, MD Attending Obstetrician and Gynecologist [facility]

## 2017-02-07 NOTE — OR Nursing (Signed)
Pt continues to c/o of nausea. 6.25 mg IV phenergan given w/ 10 cc NS. IV site clear. Paged DR Ward to get RX for nausea med for pt at home.

## 2017-02-07 NOTE — Anesthesia Post-op Follow-up Note (Signed)
Anesthesia QCDR form completed.        

## 2017-02-07 NOTE — Discharge Instructions (Addendum)
Discharge instructions:  Call office if you have any of the following: fever >101 F, chills, excessive vaginal bleeding, incision drainage or problems, leg pain or redness, or any other concerns.   Activity: Do not lift > 10 lbs for 8 weeks.  No intercourse or tampons for 8 weeks.  No driving for 1-2 weeks.   You may feel some pain in your upper right abdomen/rib and right shoulder.  This is from the gas in the abdomen for surgery. This will subside over time, please be patient!  Take 800mg  Ibuprofen and 1000mg  Tylenol around the clock, every 6 hours for at least the first 3-5 days.  After this you can take as needed.  This will help decrease inflammation and promote healing.  The narcotics you'll take just as needed, as they just trick your brain into thinking its not in pain.    Please don't limit yourself in terms of routine activity.  You will be able to do most things, although they may take longer to do or be a little painful.  You can do it!  Don't be a hero, but don't be a wimp either!    AMBULATORY SURGERY  DISCHARGE INSTRUCTIONS   1) The drugs that you were given will stay in your system until tomorrow so for the next 24 hours you should not:  A) Drive an automobile B) Make any legal decisions C) Drink any alcoholic beverage   2) You may resume regular meals tomorrow.  Today it is better to start with liquids and gradually work up to solid foods.  You may eat anything you prefer, but it is better to start with liquids, then soup and crackers, and gradually work up to solid foods.   3) Please notify your doctor immediately if you have any unusual bleeding, trouble breathing, redness and pain at the surgery site, drainage, fever, or pain not relieved by medication.    4) Additional Instructions: TAKE A STOOL SOFTENER TWICE A DAY WHILE TAKING NARCOTIC PAIN MEDICINE TO PREVENT CONSTIPATION                                                   USE THE INCENTIVE SPIROMETER GIVEN  TO YOU AT YOUR PRE ADMIT TESTING VISIT   Please contact your physician with any problems or Same Day Surgery at [phone], Monday through Friday 6 am to 4 pm, or  at [facility] Main number at [phone].

## 2017-02-07 NOTE — Anesthesia Preprocedure Evaluation (Signed)
Anesthesia Evaluation  Patient identified by MRN, date of birth, ID band Patient awake    Reviewed: Allergy & Precautions, NPO status , Patient's Chart, lab work & pertinent test results  History of Anesthesia Complications Negative for: history of anesthetic complications  Airway Mallampati: II  TM Distance: >3 FB Neck ROM: Full    Dental no notable dental hx.    Pulmonary neg pulmonary ROS, neg sleep apnea, neg COPD,    breath sounds clear to auscultation- rhonchi (-) wheezing      Cardiovascular Exercise Tolerance: Good (-) hypertension(-) CAD and (-) Past MI  Rhythm:Regular Rate:Normal - Systolic murmurs and - Diastolic murmurs    Neuro/Psych negative neurological ROS  negative psych ROS   GI/Hepatic Neg liver ROS, hiatal hernia, GERD  ,  Endo/Other  negative endocrine ROSneg diabetes  Renal/GU negative Renal ROS     Musculoskeletal negative musculoskeletal ROS (+)   Abdominal (+) - obese,   Peds  Hematology negative hematology ROS (+)   Anesthesia Other Findings Past Medical History: No date: GERD (gastroesophageal reflux disease)     Comment:  NO MEDS No date: Heart murmur     Comment:  ASYMPTOMATIC No date: History of hiatal hernia     Comment:  SMALL 2009: History of methicillin resistant staphylococcus aureus (MRSA)     Comment:  FROM SPIDER BITE No date: Hypercholesteremia   Reproductive/Obstetrics                             Anesthesia Physical Anesthesia Plan  ASA: II  Anesthesia Plan: General   Post-op Pain Management:    Induction: Intravenous  PONV Risk Score and Plan: 2 and Dexamethasone, Ondansetron and Midazolam  Airway Management Planned: Oral ETT  Additional Equipment:   Intra-op Plan:   Post-operative Plan: Extubation in OR  Informed Consent: I have reviewed the patients History and Physical, chart, labs and discussed the procedure including the  risks, benefits and alternatives for the proposed anesthesia with the patient or authorized representative who has indicated his/her understanding and acceptance.   Dental advisory given  Plan Discussed with: CRNA and Anesthesiologist  Anesthesia Plan Comments:         Anesthesia Quick Evaluation

## 2017-02-11 LAB — SURGICAL PATHOLOGY

## 2017-02-12 LAB — CYTOLOGY - NON PAP

## 2017-03-23 LAB — TYPE AND SCREEN
ABO/RH(D): AB POS
ANTIBODY SCREEN: NEGATIVE

## 2017-06-18 ENCOUNTER — Ambulatory Visit
Admission: RE | Admit: 2017-06-18 | Discharge: 2017-06-18 | Disposition: A | Discharge status: Home / Self Care | Payer: BLUE CROSS/BLUE SHIELD | Source: Ambulatory Visit | Attending: Gastroenterology | Admitting: Gastroenterology

## 2017-06-18 ENCOUNTER — Ambulatory Visit: Payer: BLUE CROSS/BLUE SHIELD | Admitting: Certified Registered Nurse Anesthetist

## 2017-06-18 ENCOUNTER — Encounter: Payer: Self-pay | Admitting: *Deleted

## 2017-06-18 ENCOUNTER — Encounter
Admission: RE | Disposition: A | Discharge status: Home / Self Care | Payer: Self-pay | Source: Ambulatory Visit | Attending: Gastroenterology

## 2017-06-18 DIAGNOSIS — Z8614 Personal history of Methicillin resistant Staphylococcus aureus infection: Secondary | ICD-10-CM | POA: Diagnosis not present

## 2017-06-18 DIAGNOSIS — E7800 Pure hypercholesterolemia, unspecified: Secondary | ICD-10-CM | POA: Insufficient documentation

## 2017-06-18 DIAGNOSIS — K621 Rectal polyp: Secondary | ICD-10-CM | POA: Diagnosis not present

## 2017-06-18 DIAGNOSIS — Z800 Family history of malignant neoplasm of digestive organs: Secondary | ICD-10-CM | POA: Diagnosis not present

## 2017-06-18 DIAGNOSIS — K5730 Diverticulosis of large intestine without perforation or abscess without bleeding: Secondary | ICD-10-CM | POA: Insufficient documentation

## 2017-06-18 DIAGNOSIS — Z79899 Other long term (current) drug therapy: Secondary | ICD-10-CM | POA: Insufficient documentation

## 2017-06-18 DIAGNOSIS — Z79890 Hormone replacement therapy (postmenopausal): Secondary | ICD-10-CM | POA: Diagnosis not present

## 2017-06-18 DIAGNOSIS — R194 Change in bowel habit: Secondary | ICD-10-CM | POA: Insufficient documentation

## 2017-06-18 HISTORY — PX: COLONOSCOPY WITH PROPOFOL: SHX5780

## 2017-06-18 SURGERY — COLONOSCOPY WITH PROPOFOL
Anesthesia: General

## 2017-06-18 MED ORDER — PROPOFOL 500 MG/50ML IV EMUL
INTRAVENOUS | Status: AC
  Filled 2017-06-18: qty 50

## 2017-06-18 MED ORDER — PROPOFOL 500 MG/50ML IV EMUL
INTRAVENOUS | Status: DC | PRN
  Administered 2017-06-18: 140 ug/kg/min via INTRAVENOUS

## 2017-06-18 MED ORDER — LIDOCAINE HCL (CARDIAC) PF 100 MG/5ML IV SOSY
PREFILLED_SYRINGE | INTRAVENOUS | Status: DC | PRN
  Administered 2017-06-18: 50 mg via INTRAVENOUS

## 2017-06-18 MED ORDER — LIDOCAINE HCL (PF) 2 % IJ SOLN
INTRAMUSCULAR | Status: AC
Start: 2017-06-18 — End: 2017-06-18
  Filled 2017-06-18: qty 10

## 2017-06-18 MED ORDER — SODIUM CHLORIDE 0.9 % IV SOLN
INTRAVENOUS | Status: DC
  Administered 2017-06-18: 1000 mL via INTRAVENOUS

## 2017-06-18 MED ORDER — PROPOFOL 10 MG/ML IV BOLUS
INTRAVENOUS | Status: DC | PRN
  Administered 2017-06-18: 19 mg via INTRAVENOUS
  Administered 2017-06-18: 100 mg via INTRAVENOUS
  Administered 2017-06-18: 19 mg via INTRAVENOUS

## 2017-06-18 NOTE — H&P (Signed)
Outpatient short stay form Pre-procedure 06/18/2017 8:14 AM Christena DeemMartin U Quran Vasco MD  Primary Physician: Damaris SchoonerSusan Dhivianathan MD  Reason for visit: Colonoscopy  History of present illness: Patient is a 62 year old female venting today for colonoscopy due to change of bowel habits and there is a family history of colon cancer primary relative, sister.  She tolerated prep although did have some nausea and emesis with it.  Takes no aspirin or blood thinning agent.  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  Is found to have a very elevated CA-19-9 however when this lesion was removed her CA-19-9 dropped very quickly.  Histology on the lesion shows no evidence of a density.  Recent CA-19-9 was only 36 having dropped from over 4000 with this surgery.  First range is 0-35. Her last colonoscopy was 09/10/2014 gated for adenoma at that time.  She has had some change of bowel habits with a feeling of incomplete defecation and poor clearance of stool from the rectal vault necessitating a lot of cleaning.  Discussed this at length today.   Current Facility-Administered Medications:  .  0.9 %  sodium chloride infusion, , Intravenous, Continuous, Christena DeemSkulskie, Yossi Hinchman U, MD, Last Rate: 20 mL/hr at 06/18/17 0802, 1,000 mL at 06/18/17 0802  Medications Prior to Admission  Medication Sig Dispense Refill Last Dose  . acetaminophen (TYLENOL) 500 MG tablet Take 500 mg by mouth daily as needed for headache.   Past Month at Unknown time  . Alum & Mag Hydroxide-Simeth (MYLANTA PO) Take 1 tablet by mouth as needed.   02/05/2017  . atorvastatin (LIPITOR) 10 MG tablet Take 10 mg by mouth every morning.    02/07/2017 at Unknown time  . estradiol (ESTRACE) 2 MG tablet Take 2 mg by mouth daily.   02/06/2017 at Unknown time  . ibuprofen (ADVIL,MOTRIN) 800 MG tablet Take 1 tablet (800 mg total) by mouth every 6 (six) hours. 45 tablet 0   .  loratadine (CLARITIN) 10 MG tablet Take 10 mg by mouth daily as needed for allergies.   10/07/2016  . medroxyPROGESTERone (PROVERA) 2.5 MG tablet Take 2.5 mg by mouth daily.   02/06/2017 at Unknown time  . oxyCODONE (ROXICODONE) 5 MG immediate release tablet Take 1 tablet (5 mg total) by mouth every 4 (four) hours as needed. 11 tablet 0      Allergies  Allergen Reactions  . Sulfa Antibiotics Rash     Past Medical History:  Diagnosis Date  . GERD (gastroesophageal reflux disease)    NO MEDS  . Heart murmur    ASYMPTOMATIC  . History of hiatal hernia    SMALL  . History of methicillin resistant staphylococcus aureus (MRSA) 2009   FROM SPIDER BITE  . Hypercholesteremia     Review of systems:      Physical Exam    Heart and lungs: Good rate and rhythm without rub or gallop, lungs are bilaterally clear.    HEENT: Normocephalic atraumatic eyes are anicteric    Other:    Pertinant exam for procedure: Soft nontender nondistended bowel sounds positive normoactive.    Planned proceedures: Colonoscopy and indicated procedures. I have discussed the risks benefits and complications of procedures to include not limited to bleeding, infection, perforation and the risk of sedation and the patient wishes to proceed.    Christena DeemMartin U Kafi Dotter, MD Gastroenterology 06/18/2017  8:14 AM

## 2017-06-18 NOTE — Anesthesia Post-op Follow-up Note (Signed)
Anesthesia QCDR form completed.        

## 2017-06-18 NOTE — Transfer of Care (Signed)
Immediate Anesthesia Transfer of Care Note  Patient: Allison CamelKathy Rihn  Procedure(s) Performed: COLONOSCOPY WITH PROPOFOL (N/A )  Patient Location: PACU and Endoscopy Unit  Anesthesia Type:General  Level of Consciousness: drowsy  Airway & Oxygen Therapy: Patient Spontanous Breathing and Patient connected to nasal cannula oxygen  Post-op Assessment: Report given to RN and Post -op Vital signs reviewed and stable  Post vital signs: Reviewed and stable  Last Vitals:  Vitals Value Taken Time  BP 111/66 06/18/2017  8:58 AM  Temp    Pulse 73 06/18/2017  8:58 AM  Resp 15 06/18/2017  8:58 AM  SpO2 98 % 06/18/2017  8:58 AM  Vitals shown include unvalidated device data.  Last Pain:  Vitals:   06/18/17 0748  TempSrc: Tympanic  PainSc: 0-No pain         Complications: No apparent anesthesia complications

## 2017-06-18 NOTE — Anesthesia Postprocedure Evaluation (Signed)
Anesthesia Post Note  Patient: Allison Crane  Procedure(s) Performed: COLONOSCOPY WITH PROPOFOL (N/A )  Patient location during evaluation: Endoscopy Anesthesia Type: General Level of consciousness: awake and alert Pain management: pain level controlled Vital Signs Assessment: post-procedure vital signs reviewed and stable Respiratory status: spontaneous breathing, nonlabored ventilation, respiratory function stable and patient connected to nasal cannula oxygen Cardiovascular status: blood pressure returned to baseline and stable Postop Assessment: no apparent nausea or vomiting Anesthetic complications: no     Last Vitals:  Vitals:   06/18/17 0858 06/18/17 0940  BP: 111/66 138/69  Pulse:    Resp: 16   Temp: (!) 36.1 C   SpO2: 99% 100%    Last Pain:  Vitals:   06/18/17 0951  TempSrc:   PainSc: 0-No pain                 Lenard SimmerAndrew Jamyron Redd

## 2017-06-18 NOTE — Anesthesia Preprocedure Evaluation (Signed)
Anesthesia Evaluation  Patient identified by MRN, date of birth, ID band Patient awake    Reviewed: Allergy & Precautions, NPO status , Patient's Chart, lab work & pertinent test results  History of Anesthesia Complications Negative for: history of anesthetic complications  Airway Mallampati: II  TM Distance: >3 FB Neck ROM: Full    Dental  (+) Dental Advidsory Given   Pulmonary neg pulmonary ROS, neg sleep apnea, neg COPD,    breath sounds clear to auscultation- rhonchi (-) wheezing      Cardiovascular Exercise Tolerance: Good (-) hypertension(-) CAD and (-) Past MI  Rhythm:Regular Rate:Normal - Systolic murmurs and - Diastolic murmurs    Neuro/Psych negative neurological ROS  negative psych ROS   GI/Hepatic Neg liver ROS, hiatal hernia, GERD  ,  Endo/Other  negative endocrine ROSneg diabetes  Renal/GU negative Renal ROS     Musculoskeletal negative musculoskeletal ROS (+)   Abdominal (+) - obese,   Peds  Hematology negative hematology ROS (+)   Anesthesia Other Findings Past Medical History: No date: GERD (gastroesophageal reflux disease)     Comment:  NO MEDS No date: Heart murmur     Comment:  ASYMPTOMATIC No date: History of hiatal hernia     Comment:  SMALL 2009: History of methicillin resistant staphylococcus aureus (MRSA)     Comment:  FROM SPIDER BITE No date: Hypercholesteremia   Reproductive/Obstetrics                             Anesthesia Physical  Anesthesia Plan  ASA: II  Anesthesia Plan: General   Post-op Pain Management:    Induction: Intravenous  PONV Risk Score and Plan: 3 and Propofol infusion  Airway Management Planned: Nasal Cannula  Additional Equipment:   Intra-op Plan:   Post-operative Plan:   Informed Consent: I have reviewed the patients History and Physical, chart, labs and discussed the procedure including the risks, benefits and  alternatives for the proposed anesthesia with the patient or authorized representative who has indicated his/her understanding and acceptance.   Dental advisory given  Plan Discussed with: CRNA and Anesthesiologist  Anesthesia Plan Comments:         Anesthesia Quick Evaluation

## 2017-06-18 NOTE — Op Note (Addendum)
[facility] Gastroenterology Patient Name: Allison CamelKathy Crane Procedure Date: 06/18/2017 8:09 AM MRN: [id number] Account #: [id number] Date of Birth: 05/12/1955 Admit Type: Outpatient Age: 62 [facility] ROOM 1 Gender: Female Note Status: Finalized Procedure:            Colonoscopy Indications:          Family history of colon cancer in a first-degree                        relative, Change in bowel habits Providers:            Christena DeemMartin U. Skulskie, MD Referring MD:         Damaris SchoonerSusan Dhivianathan MD, MD (Referring MD) Medicines:            Monitored Anesthesia Care Complications:        No immediate complications. Procedure:            Pre-Anesthesia Assessment:                       - ASA Grade Assessment: II - A patient with mild                        systemic disease.                       After obtaining informed consent, the colonoscope was                        passed under direct vision. Throughout the procedure,                        the patient's blood pressure, pulse, and oxygen                        saturations were monitored continuously. The                        Colonoscope was introduced through the anus and                        advanced to the the cecum, identified by appendiceal                        orifice and ileocecal valve. The colonoscopy was                        performed without difficulty. The patient tolerated the                        procedure well. The quality of the bowel preparation                        was good. Findings:      A few small-mouthed diverticula were found in the sigmoid colon.      A 2 mm polyp was found in the rectum. The polyp was sessile. The polyp       was removed with a cold biopsy forceps. Resection and retrieval were       complete.      A localized area of granular mucosa was found in the rectum. This is  likely a scope mark/rub. Biopsies were taken with a cold forceps for       histology.  The exam was otherwise without abnormality. I was unable to retroflex       due to some narrowness of the rectal vault, however the entire vault was       easily seen in forward view, note small anal pillar.      The digital rectal exam was normal. Impression:           - Diverticulosis in the sigmoid colon.                       - One 2 mm polyp in the rectum, removed with a cold                        biopsy forceps. Resected and retrieved.                       - Granularity in the rectum. Biopsied.                       - The examination was otherwise normal. Recommendation:       - Discharge patient to home.                       - Advance diet as tolerated.                       - Await pathology results.                       - Return to GI clinic in 6 weeks.                       - Use Citrucel one tablespoon PO daily daily. Procedure Code(s):    --- Professional ---                       [phone], Colonoscopy, flexible; with biopsy, single or                        multiple Diagnosis Code(s):    --- Professional ---                       K62.1, Rectal polyp                       K62.89, Other specified diseases of anus and rectum                       Z80.0, Family history of malignant neoplasm of                        digestive organs                       R19.4, Change in bowel habit                       K57.30, Diverticulosis of large intestine without                        perforation or abscess without bleeding CPT copyright 2017  American Medical Association. All rights reserved. The codes documented in this report are preliminary and upon coder review may  be revised to meet current compliance requirements. Christena Deem, MD 06/18/2017 9:00:19 AM This report has been signed electronically. Number of Addenda: 0 Note Initiated On: 06/18/2017 8:09 AM Scope Withdrawal Time: 0 hours 11 minutes 43 seconds  Total Procedure Duration: 0 hours 26 minutes 44 seconds        [facility]

## 2017-06-19 LAB — SURGICAL PATHOLOGY

## 2017-06-20 ENCOUNTER — Encounter: Payer: Self-pay | Admitting: Gastroenterology

## 2020-01-16 IMAGING — CR DG ABDOMEN 1V
2 series · 2 of 2 positions shown · non-contrast
Comparison: None.

CLINICAL DATA: Abdominal pain and rectal pain 1 week. Constipation.

EXAM:
ABDOMEN - 1 VIEW

[abdomen kub (1 of 2)]
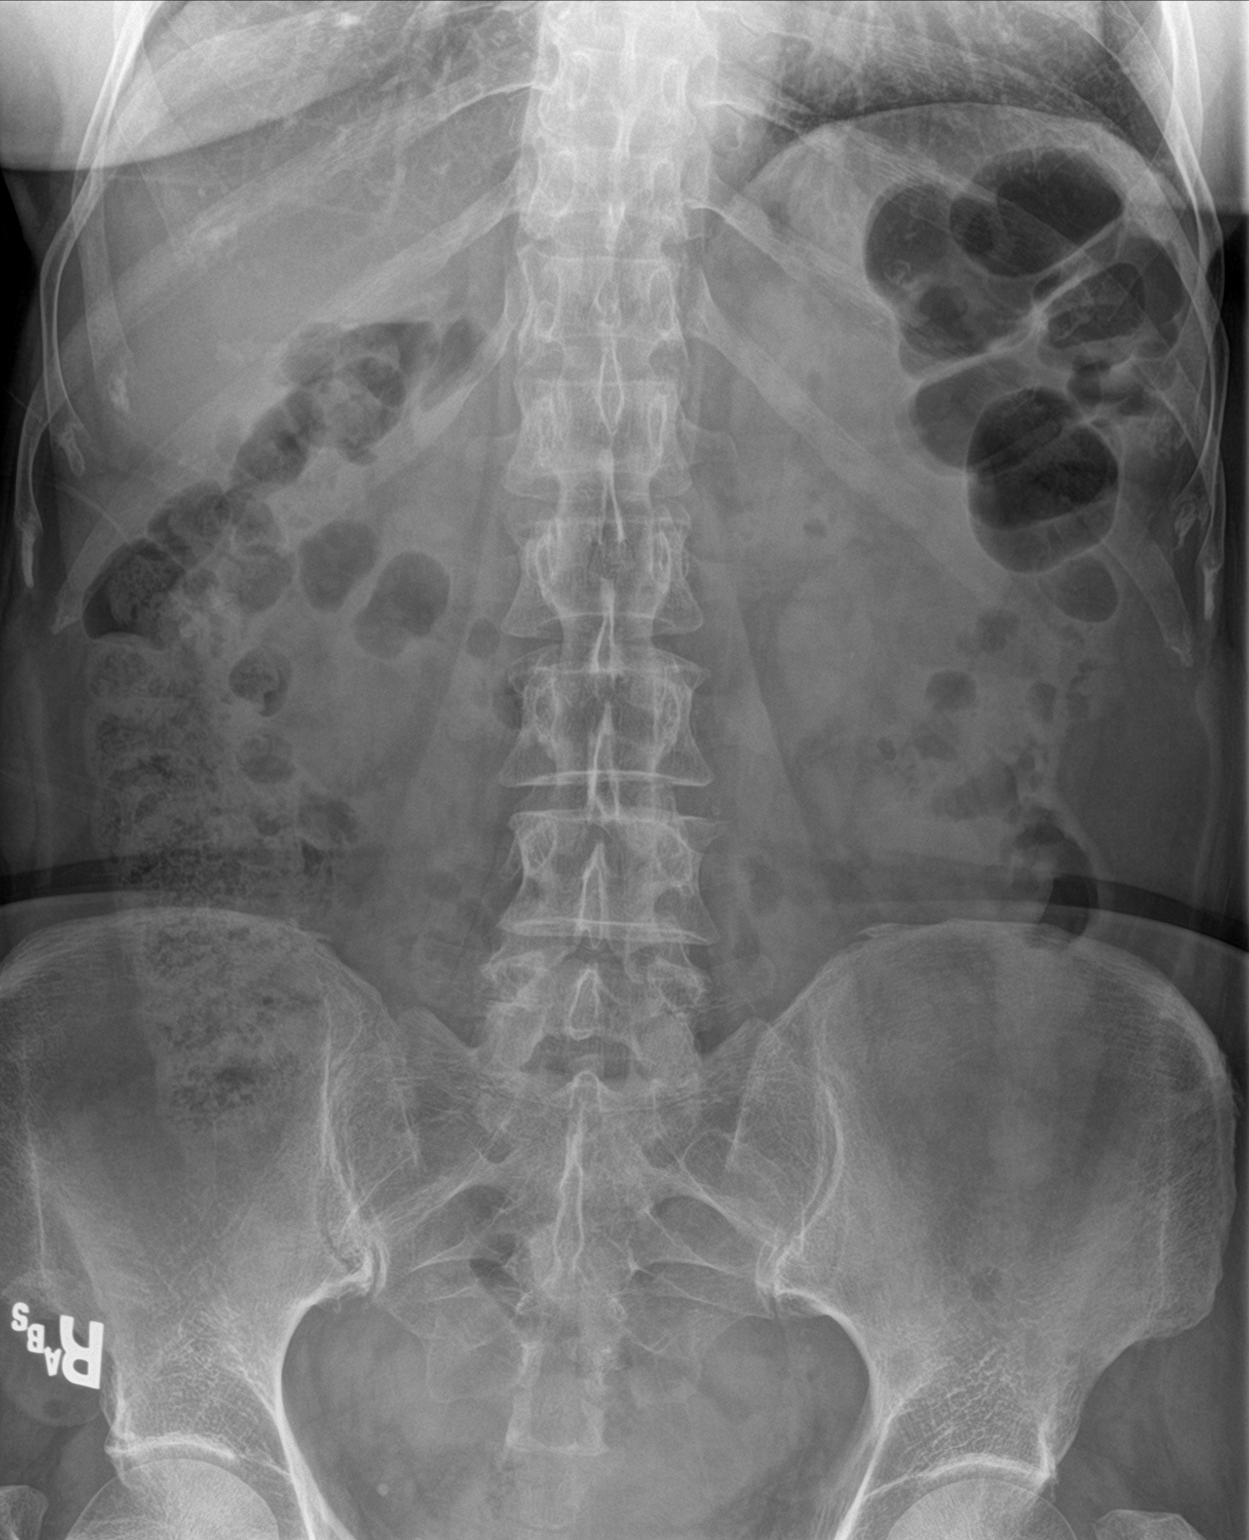

[abdomen kub (2 of 2)]
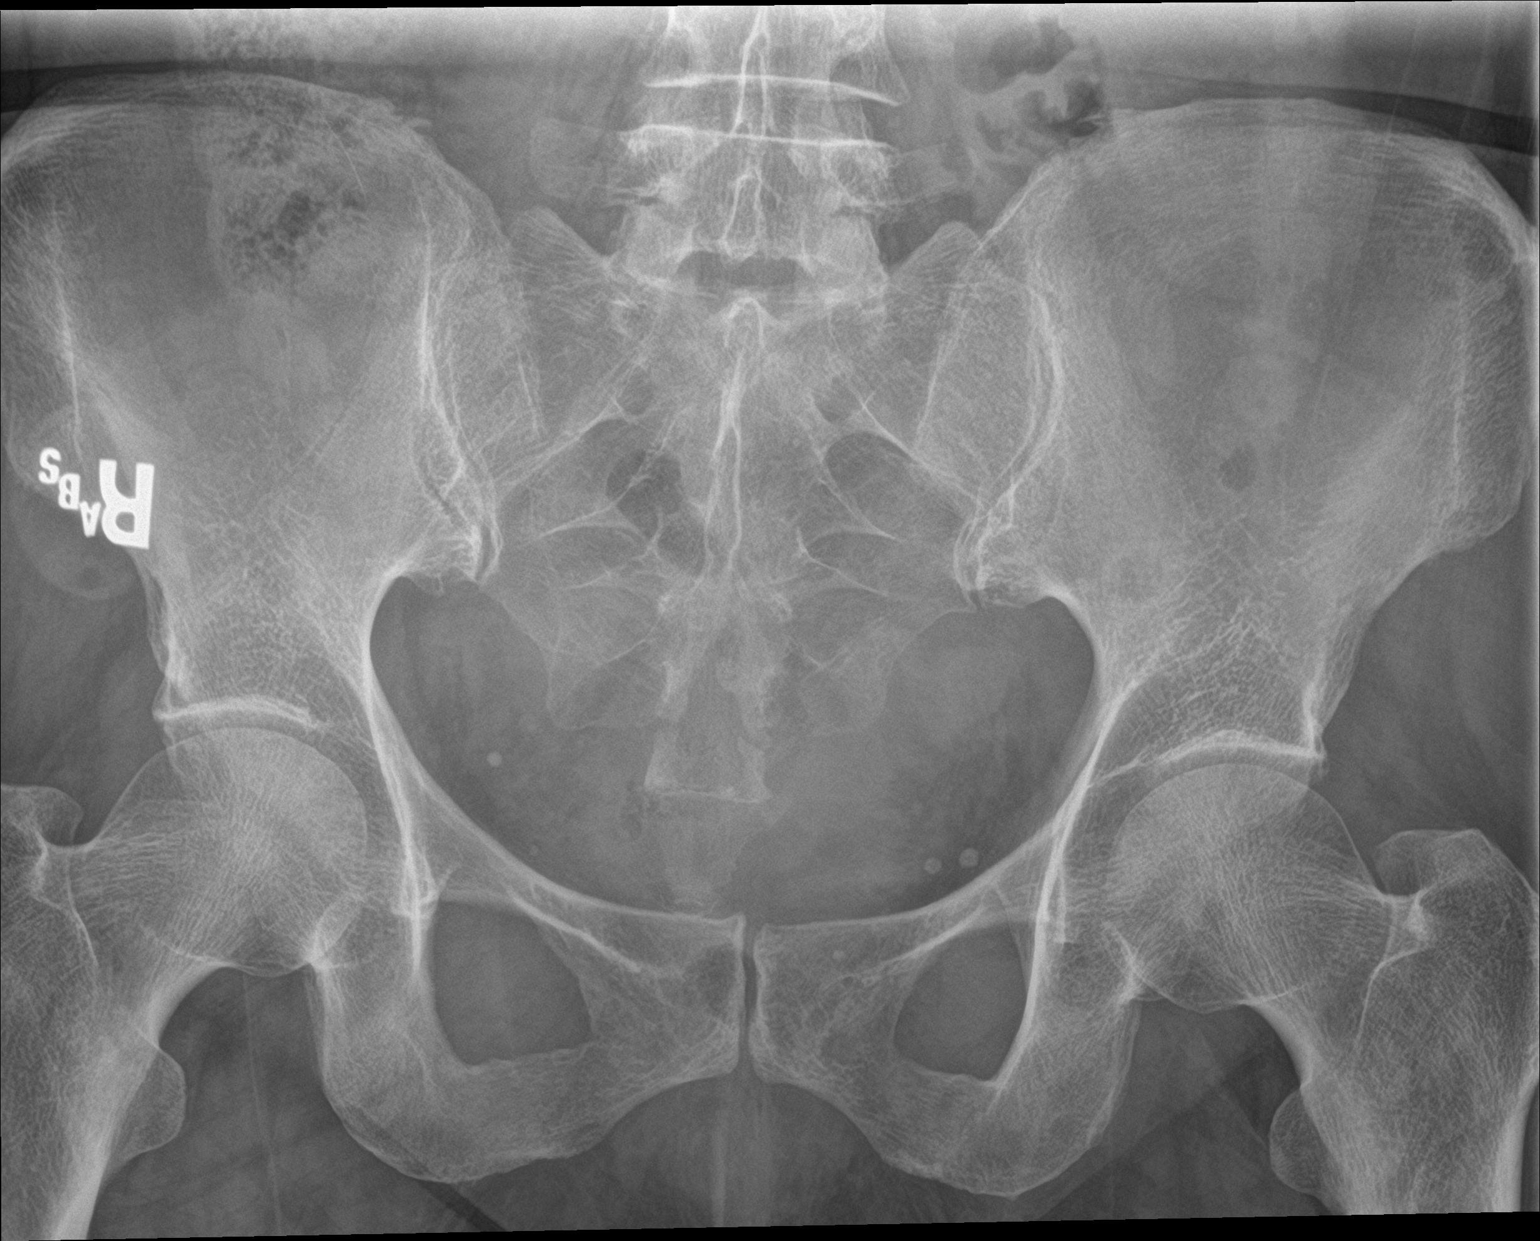

[2 of 2 positions shown; findings below may reference images not displayed]

FINDINGS: Bowel gas pattern is nonobstructive. No mass or mass effect. Several
pelvic phleboliths are present. Mild degenerate change of the spine.
IMPRESSION: Nonobstructive bowel gas pattern.

## 2023-02-13 ENCOUNTER — Ambulatory Visit: Payer: Medicare Other

## 2023-02-13 DIAGNOSIS — K5730 Diverticulosis of large intestine without perforation or abscess without bleeding: Secondary | ICD-10-CM | POA: Diagnosis not present

## 2023-02-13 DIAGNOSIS — K640 First degree hemorrhoids: Secondary | ICD-10-CM | POA: Diagnosis not present

## 2023-02-13 DIAGNOSIS — Z800 Family history of malignant neoplasm of digestive organs: Secondary | ICD-10-CM | POA: Diagnosis not present

## 2023-02-13 DIAGNOSIS — Z1211 Encounter for screening for malignant neoplasm of colon: Secondary | ICD-10-CM | POA: Diagnosis not present

## 2023-12-16 ENCOUNTER — Other Ambulatory Visit: Payer: Self-pay

## 2023-12-16 ENCOUNTER — Emergency Department
Admission: EM | Admit: 2023-12-16 | Discharge: 2023-12-16 | Disposition: A | Discharge status: Home / Self Care | Attending: Emergency Medicine | Admitting: Emergency Medicine

## 2023-12-16 ENCOUNTER — Emergency Department

## 2023-12-16 DIAGNOSIS — Y9301 Activity, walking, marching and hiking: Secondary | ICD-10-CM | POA: Insufficient documentation

## 2023-12-16 DIAGNOSIS — S82141A Displaced bicondylar fracture of right tibia, initial encounter for closed fracture: Secondary | ICD-10-CM | POA: Insufficient documentation

## 2023-12-16 DIAGNOSIS — S8991XA Unspecified injury of right lower leg, initial encounter: Secondary | ICD-10-CM | POA: Diagnosis present

## 2023-12-16 DIAGNOSIS — Y92481 Parking lot as the place of occurrence of the external cause: Secondary | ICD-10-CM | POA: Diagnosis not present

## 2023-12-16 MED ORDER — ACETAMINOPHEN 325 MG PO TABS
650.0000 mg | ORAL_TABLET | Freq: Once | ORAL | Status: AC
  Administered 2023-12-16: 650 mg via ORAL
  Filled 2023-12-16: qty 2

## 2023-12-16 MED ORDER — HYDROCODONE-ACETAMINOPHEN 5-325 MG PO TABS: 1.0000 | ORAL_TABLET | Freq: Four times a day (QID) | ORAL | 0 refills | Status: AC | PRN

## 2023-12-16 MED ORDER — HYDROCODONE-ACETAMINOPHEN 5-325 MG PO TABS
1.0000 | ORAL_TABLET | Freq: Once | ORAL | Status: AC
  Administered 2023-12-16: 1 via ORAL
  Filled 2023-12-16: qty 1

## 2023-12-16 NOTE — ED Notes (Signed)
 Pt declined discharge vitals

## 2023-12-16 NOTE — ED Notes (Signed)
Pt assisted to bathroom by daughter.
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
## 2023-12-16 NOTE — ED Notes (Signed)
 Pt at CT

## 2023-12-16 NOTE — Discharge Instructions (Addendum)
 The x-ray and CT scan of your knee showed that you have a tibial plateau fracture.  This will need surgery to fix.  Please schedule follow-up appointment with the orthopedic provider who is attached.  Wear the knee immobilizer at all times except when bathing.  Do not put any weight on your right leg.  Please use the crutches to get around.  You can take 650 mg of Tylenol  and 600 mg of ibuprofen  every 6 hours as needed for pain. You can use ice, heat, muscle creams and other topical pain relievers as well.  I have sent a strong pain medication called Norco to the pharmacy.  This medication can be taken every 4-6 hours as needed for severe or breakthrough pain.  This medication can cause dependency so only take if you are unable to control your pain with other medications.  It will make you sleepy so do not drive or operate heavy machinery after taking it.  Do not drink alcohol while taking this medication.  This medication can also cause constipation so please take an over-the-counter stool softener like MiraLAX or Colace while taking it.  This medication contains both hydrocodone  and acetaminophen .  Do not take Tylenol  at the same time.  You can take the Norco or Tylenol  but not both.  Watch for any worsening symptoms like increased pain, increased swelling, loss of sensation in the leg, loss of pulses in the leg, cold skin in the leg.  If this occurs please proceed to the emergency department immediately as this is concerning for compartment syndrome.
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
## 2023-12-19 ENCOUNTER — Encounter (HOSPITAL_COMMUNITY): Payer: Self-pay | Admitting: Orthopedic Surgery

## 2023-12-19 ENCOUNTER — Other Ambulatory Visit: Payer: Self-pay

## 2023-12-19 NOTE — Progress Notes (Addendum)
 SDW CALL  Patient was given pre-op instructions over the phone. The opportunity was given for the patient to ask questions. No further questions asked. Patient verbalized understanding of instructions given.   PCP - Dr. Dino Cardiologist - denies  PPM/ICD - denies Device Orders - n/a Rep Notified - n/a  Chest x-ray - denies EKG - denies Stress Test - denies ECHO - denies Cardiac Cath - denies  Sleep Study - denies  No DM  Last dose of GLP1 agonist-  n/a GLP1 instructions:  n/a  Blood Thinner Instructions:  n/a Aspirin Instructions:  n/a  ERAS Protcol - clears until 0730 PRE-SURGERY Ensure or G2-  n/a  COVID TEST- n/a   Anesthesia review: yes - heart murmur - patient denies chest pain and shortness of breath - she states that someone once mentioned hearing a murmur but nothing has been mentioned since.    Patient denies shortness of breath, fever, cough and chest pain over the phone call   All instructions explained to the patient, with a verbal understanding of the material. Patient agrees to go over the instructions while at home for a better understanding.
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
## 2023-12-20 ENCOUNTER — Encounter (HOSPITAL_COMMUNITY): Admission: RE | Disposition: A | Discharge status: Home / Self Care | Payer: Self-pay | Source: Home / Self Care

## 2023-12-20 ENCOUNTER — Other Ambulatory Visit (HOSPITAL_COMMUNITY): Payer: Self-pay

## 2023-12-20 ENCOUNTER — Other Ambulatory Visit: Payer: Self-pay

## 2023-12-20 ENCOUNTER — Inpatient Hospital Stay (HOSPITAL_COMMUNITY): Admitting: Certified Registered Nurse Anesthetist

## 2023-12-20 ENCOUNTER — Encounter (HOSPITAL_COMMUNITY): Payer: Self-pay | Admitting: Orthopedic Surgery

## 2023-12-20 ENCOUNTER — Inpatient Hospital Stay (HOSPITAL_COMMUNITY)

## 2023-12-20 ENCOUNTER — Ambulatory Visit (HOSPITAL_COMMUNITY)
Admission: RE | Admit: 2023-12-20 | Discharge: 2023-12-20 | Disposition: A | Discharge status: Home / Self Care | Attending: Orthopedic Surgery | Admitting: Orthopedic Surgery

## 2023-12-20 DIAGNOSIS — S82141A Displaced bicondylar fracture of right tibia, initial encounter for closed fracture: Secondary | ICD-10-CM | POA: Diagnosis not present

## 2023-12-20 DIAGNOSIS — M80861A Other osteoporosis with current pathological fracture, right lower leg, initial encounter for fracture: Secondary | ICD-10-CM

## 2023-12-20 DIAGNOSIS — K219 Gastro-esophageal reflux disease without esophagitis: Secondary | ICD-10-CM | POA: Diagnosis not present

## 2023-12-20 DIAGNOSIS — Z87311 Personal history of (healed) other pathological fracture: Secondary | ICD-10-CM

## 2023-12-20 DIAGNOSIS — S82121A Displaced fracture of lateral condyle of right tibia, initial encounter for closed fracture: Secondary | ICD-10-CM | POA: Diagnosis present

## 2023-12-20 DIAGNOSIS — Z01818 Encounter for other preprocedural examination: Secondary | ICD-10-CM

## 2023-12-20 DIAGNOSIS — S83203A Other tear of unspecified meniscus, current injury, right knee, initial encounter: Secondary | ICD-10-CM | POA: Diagnosis present

## 2023-12-20 HISTORY — PX: ORIF TIBIA PLATEAU: SHX2132

## 2023-12-20 HISTORY — PX: KNEE ARTHROTOMY: SHX5881

## 2023-12-20 LAB — CBC WITH DIFFERENTIAL/PLATELET
Abs Immature Granulocytes: 0.01 K/uL (ref 0.00–0.07)
Basophils Absolute: 0.1 K/uL (ref 0.0–0.1)
Basophils Relative: 1 %
Eosinophils Absolute: 0.2 K/uL (ref 0.0–0.5)
Eosinophils Relative: 3 %
HCT: 41.3 % (ref 36.0–46.0)
Hemoglobin: 13.9 g/dL (ref 12.0–15.0)
Immature Granulocytes: 0 %
Lymphocytes Relative: 27 %
Lymphs Abs: 1.5 K/uL (ref 0.7–4.0)
MCH: 30.7 pg (ref 26.0–34.0)
MCHC: 33.7 g/dL (ref 30.0–36.0)
MCV: 91.2 fL (ref 80.0–100.0)
Monocytes Absolute: 0.5 K/uL (ref 0.1–1.0)
Monocytes Relative: 9 %
Neutro Abs: 3.5 K/uL (ref 1.7–7.7)
Neutrophils Relative %: 60 %
Platelets: 303 K/uL (ref 150–400)
RBC: 4.53 MIL/uL (ref 3.87–5.11)
RDW: 12.4 % (ref 11.5–15.5)
WBC: 5.8 K/uL (ref 4.0–10.5)
nRBC: 0 % (ref 0.0–0.2)

## 2023-12-20 LAB — COMPREHENSIVE METABOLIC PANEL WITH GFR
ALT: 38 U/L (ref 0–44)
AST: 28 U/L (ref 15–41)
Albumin: 4.5 g/dL (ref 3.5–5.0)
Alkaline Phosphatase: 146 U/L — ABNORMAL HIGH (ref 38–126)
Anion gap: 10 (ref 5–15)
BUN: 19 mg/dL (ref 8–23)
CO2: 26 mmol/L (ref 22–32)
Calcium: 9.8 mg/dL (ref 8.9–10.3)
Chloride: 104 mmol/L (ref 98–111)
Creatinine, Ser: 0.62 mg/dL (ref 0.44–1.00)
GFR, Estimated: >60 mL/min (ref >60)
Glucose, Bld: 105 mg/dL — ABNORMAL HIGH (ref 70–99)
Potassium: 4 mmol/L (ref 3.5–5.1)
Sodium: 139 mmol/L (ref 135–145)
Total Bilirubin: 0.5 mg/dL (ref 0.0–1.2)
Total Protein: 7.5 g/dL (ref 6.5–8.1)

## 2023-12-20 SURGERY — OPEN REDUCTION INTERNAL FIXATION (ORIF) TIBIAL PLATEAU
Anesthesia: General | Site: Knee | Laterality: Right

## 2023-12-20 MED ORDER — POVIDONE-IODINE 10 % EX SWAB
2.0000 | Freq: Once | CUTANEOUS | Status: AC
  Administered 2023-12-20: 09:00:00 2 via TOPICAL

## 2023-12-20 MED ORDER — 0.9 % SODIUM CHLORIDE (POUR BTL) OPTIME
TOPICAL | Status: DC | PRN
  Administered 2023-12-20: 13:00:00 1000 mL

## 2023-12-20 MED ORDER — AMISULPRIDE (ANTIEMETIC) 5 MG/2ML IV SOLN: 10.0000 mg | Freq: Once | INTRAVENOUS | Status: DC | PRN

## 2023-12-20 MED ORDER — FENTANYL CITRATE (PF) 100 MCG/2ML IJ SOLN
INTRAMUSCULAR | Status: AC
  Filled 2023-12-20: qty 2

## 2023-12-20 MED ORDER — OXYCODONE HCL 5 MG PO TABS
5.0000 mg | ORAL_TABLET | Freq: Once | ORAL | Status: AC | PRN
  Administered 2023-12-20: 17:00:00 5 mg via ORAL

## 2023-12-20 MED ORDER — CHLORHEXIDINE GLUCONATE 0.12 % MT SOLN: 15.0000 mL | Freq: Once | OROMUCOSAL | Status: AC

## 2023-12-20 MED ORDER — PHENYLEPHRINE HCL-NACL 20-0.9 MG/250ML-% IV SOLN
INTRAVENOUS | Status: DC | PRN
  Administered 2023-12-20: 12:00:00 55 ug/min via INTRAVENOUS

## 2023-12-20 MED ORDER — ONDANSETRON HCL 4 MG/2ML IJ SOLN: 4.0000 mg | Freq: Once | INTRAMUSCULAR | Status: DC | PRN

## 2023-12-20 MED ORDER — HYDROMORPHONE HCL 1 MG/ML IJ SOLN
INTRAMUSCULAR | Status: AC
  Filled 2023-12-20: qty 0.5

## 2023-12-20 MED ORDER — CHLORHEXIDINE GLUCONATE 0.12 % MT SOLN
OROMUCOSAL | Status: AC
  Administered 2023-12-20: 09:00:00 15 mL via OROMUCOSAL
  Filled 2023-12-20: qty 15

## 2023-12-20 MED ORDER — APIXABAN 2.5 MG PO TABS
2.5000 mg | ORAL_TABLET | Freq: Two times a day (BID) | ORAL | 0 refills | Status: AC
  Filled 2023-12-20: qty 60, 30d supply, fill #0

## 2023-12-20 MED ORDER — TRANEXAMIC ACID-NACL 1000-0.7 MG/100ML-% IV SOLN
1000.0000 mg | INTRAVENOUS | Status: AC
  Administered 2023-12-20: 13:00:00 1000 mg via INTRAVENOUS
  Filled 2023-12-20: qty 100

## 2023-12-20 MED ORDER — PROPOFOL 10 MG/ML IV BOLUS
INTRAVENOUS | Status: DC | PRN
  Administered 2023-12-20: 12:00:00 100 mg via INTRAVENOUS
  Administered 2023-12-20 (×2): 50 mg via INTRAVENOUS

## 2023-12-20 MED ORDER — ACETAMINOPHEN 10 MG/ML IV SOLN: 1000.0000 mg | Freq: Once | INTRAVENOUS | Status: DC | PRN

## 2023-12-20 MED ORDER — MIDAZOLAM HCL (PF) 2 MG/2ML IJ SOLN
INTRAMUSCULAR | Status: DC | PRN
  Administered 2023-12-20: 12:00:00 2 mg via INTRAVENOUS

## 2023-12-20 MED ORDER — CEFAZOLIN SODIUM-DEXTROSE 2-4 GM/100ML-% IV SOLN
2.0000 g | INTRAVENOUS | Status: AC
  Administered 2023-12-20: 12:00:00 2 g via INTRAVENOUS
  Filled 2023-12-20: qty 100

## 2023-12-20 MED ORDER — ORAL CARE MOUTH RINSE: 15.0000 mL | Freq: Once | OROMUCOSAL | Status: AC

## 2023-12-20 MED ORDER — OXYCODONE HCL 5 MG PO TABS
ORAL_TABLET | ORAL | Status: AC
  Filled 2023-12-20: qty 1

## 2023-12-20 MED ORDER — LACTATED RINGERS IV SOLN: INTRAVENOUS | Status: DC

## 2023-12-20 MED ORDER — VITAMIN D3 125 MCG (5000 UT) PO TABS
1.0000 | ORAL_TABLET | Freq: Every day | ORAL | 6 refills | Status: AC
  Filled 2023-12-20: qty 30, 30d supply, fill #0

## 2023-12-20 MED ORDER — ONDANSETRON HCL 4 MG/2ML IJ SOLN
INTRAMUSCULAR | Status: AC
  Filled 2023-12-20: qty 2

## 2023-12-20 MED ORDER — OXYCODONE HCL 5 MG/5ML PO SOLN: 5.0000 mg | Freq: Once | ORAL | Status: AC | PRN

## 2023-12-20 MED ORDER — METHOCARBAMOL 500 MG PO TABS
500.0000 mg | ORAL_TABLET | Freq: Four times a day (QID) | ORAL | 0 refills | Status: AC | PRN
  Filled 2023-12-20: qty 60, 8d supply, fill #0

## 2023-12-20 MED ORDER — BUPIVACAINE-MELOXICAM ER 400-12 MG/14ML IJ SOLN
INTRAMUSCULAR | Status: AC
  Filled 2023-12-20: qty 1

## 2023-12-20 MED ORDER — PROPOFOL 10 MG/ML IV BOLUS
INTRAVENOUS | Status: AC
  Filled 2023-12-20: qty 20

## 2023-12-20 MED ORDER — ONDANSETRON HCL 4 MG/2ML IJ SOLN
INTRAMUSCULAR | Status: DC | PRN
  Administered 2023-12-20: 13:00:00 4 mg via INTRAVENOUS

## 2023-12-20 MED ORDER — DEXAMETHASONE SOD PHOSPHATE PF 10 MG/ML IJ SOLN
INTRAMUSCULAR | Status: DC | PRN
  Administered 2023-12-20: 13:00:00 10 mg via INTRAVENOUS

## 2023-12-20 MED ORDER — LIDOCAINE 2% (20 MG/ML) 5 ML SYRINGE
INTRAMUSCULAR | Status: AC
  Filled 2023-12-20: qty 5

## 2023-12-20 MED ORDER — SUGAMMADEX SODIUM 200 MG/2ML IV SOLN
INTRAVENOUS | Status: AC
  Filled 2023-12-20: qty 2

## 2023-12-20 MED ORDER — BUPIVACAINE-MELOXICAM ER 400-12 MG/14ML IJ SOLN
INTRAMUSCULAR | Status: DC | PRN
  Administered 2023-12-20: 14:00:00 400 mg

## 2023-12-20 MED ORDER — ONDANSETRON 4 MG PO TBDP
4.0000 mg | ORAL_TABLET | Freq: Three times a day (TID) | ORAL | 0 refills | Status: AC | PRN
  Filled 2023-12-20: qty 20, 7d supply, fill #0

## 2023-12-20 MED ORDER — LIDOCAINE 2% (20 MG/ML) 5 ML SYRINGE
INTRAMUSCULAR | Status: DC | PRN
  Administered 2023-12-20: 12:00:00 100 mg via INTRAVENOUS

## 2023-12-20 MED ORDER — SUGAMMADEX SODIUM 200 MG/2ML IV SOLN
INTRAVENOUS | Status: DC | PRN
  Administered 2023-12-20: 14:00:00 200 mg via INTRAVENOUS

## 2023-12-20 MED ORDER — HYDROMORPHONE HCL 1 MG/ML IJ SOLN
INTRAMUSCULAR | Status: DC | PRN
  Administered 2023-12-20: 13:00:00 .5 mg via INTRAVENOUS

## 2023-12-20 MED ORDER — ROCURONIUM BROMIDE 10 MG/ML (PF) SYRINGE
PREFILLED_SYRINGE | INTRAVENOUS | Status: DC | PRN
  Administered 2023-12-20: 12:00:00 70 mg via INTRAVENOUS
  Administered 2023-12-20 (×2): 10 mg via INTRAVENOUS

## 2023-12-20 MED ORDER — PHENYLEPHRINE 80 MCG/ML (10ML) SYRINGE FOR IV PUSH (FOR BLOOD PRESSURE SUPPORT)
PREFILLED_SYRINGE | INTRAVENOUS | Status: DC | PRN
  Administered 2023-12-20: 13:00:00 80 ug via INTRAVENOUS

## 2023-12-20 MED ORDER — CHLORHEXIDINE GLUCONATE 4 % EX SOLN: 60.0000 mL | Freq: Once | CUTANEOUS | Status: DC

## 2023-12-20 MED ORDER — FENTANYL CITRATE (PF) 250 MCG/5ML IJ SOLN
INTRAMUSCULAR | Status: DC | PRN
  Administered 2023-12-20 (×4): 50 ug via INTRAVENOUS

## 2023-12-20 MED ORDER — ROCURONIUM BROMIDE 10 MG/ML (PF) SYRINGE
PREFILLED_SYRINGE | INTRAVENOUS | Status: AC
  Filled 2023-12-20: qty 10

## 2023-12-20 MED ORDER — OXYCODONE-ACETAMINOPHEN 7.5-325 MG PO TABS
1.0000 | ORAL_TABLET | Freq: Four times a day (QID) | ORAL | 0 refills | Status: AC | PRN
  Filled 2023-12-20: qty 40, 5d supply, fill #0

## 2023-12-20 MED ORDER — FENTANYL CITRATE (PF) 100 MCG/2ML IJ SOLN
25.0000 ug | INTRAMUSCULAR | Status: DC | PRN
  Administered 2023-12-20: 15:00:00 50 ug via INTRAVENOUS
  Administered 2023-12-20: 16:00:00 25 ug via INTRAVENOUS

## 2023-12-20 MED ORDER — PHENYLEPHRINE 80 MCG/ML (10ML) SYRINGE FOR IV PUSH (FOR BLOOD PRESSURE SUPPORT)
PREFILLED_SYRINGE | INTRAVENOUS | Status: AC
  Filled 2023-12-20: qty 10

## 2023-12-20 MED FILL — Midazolam HCl Inj 2 MG/2ML (Base Equivalent): INTRAMUSCULAR | Qty: 2 | Status: AC

## 2023-12-20 MED FILL — Fentanyl Citrate Preservative Free (PF) Inj 100 MCG/2ML: INTRAMUSCULAR | Qty: 2 | Status: AC

## 2023-12-20 SURGICAL SUPPLY — 73 items
BAG COUNTER SPONGE SURGICOUNT (BAG) ×2 IMPLANT
BIT DRILL 2.5X2.75 QC CALB (BIT) IMPLANT
BIT DRILL CAL (BIT) IMPLANT
BLADE CLIPPER SURG (BLADE) IMPLANT
BLADE SURG 10 STRL SS (BLADE) ×2 IMPLANT
BLADE SURG 15 STRL LF DISP TIS (BLADE) ×2 IMPLANT
BNDG COHESIVE 4X5 TAN STRL LF (GAUZE/BANDAGES/DRESSINGS) ×2 IMPLANT
BNDG COMPR ESMARK 6X3 LF (GAUZE/BANDAGES/DRESSINGS) ×2 IMPLANT
BNDG ELASTIC 2 VLCR STRL LF (GAUZE/BANDAGES/DRESSINGS) IMPLANT
BNDG ELASTIC 4X5.8 VLCR STR LF (GAUZE/BANDAGES/DRESSINGS) ×2 IMPLANT
BNDG ELASTIC 6INX 5YD STR LF (GAUZE/BANDAGES/DRESSINGS) ×2 IMPLANT
BNDG GAUZE DERMACEA FLUFF 4 (GAUZE/BANDAGES/DRESSINGS) ×2 IMPLANT
BRUSH SCRUB EZ 4% CHG (MISCELLANEOUS) ×2 IMPLANT
BRUSH SCRUB EZ PLAIN DRY (MISCELLANEOUS) ×4 IMPLANT
CANISTER SUCTION 3000ML PPV (SUCTIONS) ×2 IMPLANT
COVER SURGICAL LIGHT HANDLE (MISCELLANEOUS) ×2 IMPLANT
CUFF TRNQT CYL 34X4.125X (TOURNIQUET CUFF) ×2 IMPLANT
DRAPE C-ARM 42X72 X-RAY (DRAPES) ×2 IMPLANT
DRAPE C-ARMOR (DRAPES) ×2 IMPLANT
DRAPE HALF SHEET 40X57 (DRAPES) IMPLANT
DRAPE INCISE IOBAN 66X45 STRL (DRAPES) ×2 IMPLANT
DRAPE U-SHAPE 47X51 STRL (DRAPES) ×2 IMPLANT
DRSG ADAPTIC 3X8 NADH LF (GAUZE/BANDAGES/DRESSINGS) ×2 IMPLANT
DRSG MEPITEL 4X7.2 (GAUZE/BANDAGES/DRESSINGS) IMPLANT
ELECTRODE REM PT RTRN 9FT ADLT (ELECTROSURGICAL) ×2 IMPLANT
GAUZE PAD ABD 8X10 STRL (GAUZE/BANDAGES/DRESSINGS) ×4 IMPLANT
GAUZE SPONGE 4X4 12PLY STRL (GAUZE/BANDAGES/DRESSINGS) ×2 IMPLANT
GLOVE BIO SURGEON STRL SZ8 (GLOVE) ×4 IMPLANT
GLOVE BIOGEL PI IND STRL 7.5 (GLOVE) ×2 IMPLANT
GLOVE BIOGEL PI IND STRL 8 (GLOVE) ×2 IMPLANT
GLOVE SURG ORTHO LTX SZ7.5 (GLOVE) ×4 IMPLANT
GOWN STRL REUS W/ TWL LRG LVL3 (GOWN DISPOSABLE) ×4 IMPLANT
GOWN STRL REUS W/ TWL XL LVL3 (GOWN DISPOSABLE) ×2 IMPLANT
GRAFT BNE CANC CHIPS 1-8 20CC (Bone Implant) IMPLANT
IMMOBILIZER KNEE 22 UNIV (SOFTGOODS) ×2 IMPLANT
KIT BASIN OR (CUSTOM PROCEDURE TRAY) ×2 IMPLANT
KIT TURNOVER KIT B (KITS) ×2 IMPLANT
KWIRE ACE 1.6X6 (WIRE) IMPLANT
NDL 1/2 CIR CATGUT .05X1.09 (NEEDLE) IMPLANT
NDL SUT 6 .5 CRC .975X.05 MAYO (NEEDLE) IMPLANT
NEEDLE 1/2 CIR CATGUT .05X1.09 (NEEDLE) IMPLANT
PACK ORTHO EXTREMITY (CUSTOM PROCEDURE TRAY) ×2 IMPLANT
PAD ARMBOARD POSITIONER FOAM (MISCELLANEOUS) ×4 IMPLANT
PAD CAST 4YDX4 CTTN HI CHSV (CAST SUPPLIES) ×2 IMPLANT
PADDING CAST ABS COTTON 3X4 (CAST SUPPLIES) IMPLANT
PADDING CAST ABS COTTON 6X4 NS (CAST SUPPLIES) IMPLANT
PADDING CAST COTTON 6X4 STRL (CAST SUPPLIES) ×2 IMPLANT
PLATE LOCK 5H STD RT PROX TIB (Plate) IMPLANT
SCREW CORT FT 32X3.5XNONLOCK (Screw) IMPLANT
SCREW CORT T15 TPR 55X3.5XST (Screw) IMPLANT
SCREW CORTICAL 3.5MM 34MM (Screw) IMPLANT
SCREW CORTICAL 3.5MM 36MM (Screw) IMPLANT
SCREW CORTICAL 3.5MM 38MM (Screw) IMPLANT
SCREW LOCK CORT STAR 3.5X65 (Screw) IMPLANT
SCREW LOCK CORT STAR 3.5X70 (Screw) IMPLANT
SCREW LOCK CORT STAR 3.5X75 (Screw) IMPLANT
SCREW LP 3.5X70MM (Screw) IMPLANT
SOLN 0.9% NACL POUR BTL 1000ML (IV SOLUTION) ×2 IMPLANT
SOLN STERILE WATER BTL 1000 ML (IV SOLUTION) ×4 IMPLANT
SPONGE T-LAP 18X18 ~~LOC~~+RFID (SPONGE) ×2 IMPLANT
STAPLER SKIN PROX 35W (STAPLE) ×2 IMPLANT
STOCKINETTE IMPERVIOUS LG (DRAPES) ×2 IMPLANT
SUCTION TUBE FRAZIER 10FR DISP (SUCTIONS) ×2 IMPLANT
SUT ETHILON 2 0 FS 18 (SUTURE) IMPLANT
SUT PROLENE 0 CT 2 (SUTURE) ×4 IMPLANT
SUT VIC AB 0 CT1 27XBRD ANBCTR (SUTURE) ×2 IMPLANT
SUT VIC AB 1 CT1 27XBRD ANBCTR (SUTURE) ×2 IMPLANT
SUT VIC AB 2-0 CT1 TAPERPNT 27 (SUTURE) ×4 IMPLANT
TOWEL GREEN STERILE (TOWEL DISPOSABLE) ×4 IMPLANT
TOWEL GREEN STERILE FF (TOWEL DISPOSABLE) ×2 IMPLANT
TRAY FOLEY MTR SLVR 16FR STAT (SET/KITS/TRAYS/PACK) IMPLANT
TUBE CONNECTING 12X1/4 (SUCTIONS) ×2 IMPLANT
YANKAUER SUCT BULB TIP NO VENT (SUCTIONS) ×2 IMPLANT

## 2023-12-20 NOTE — Transfer of Care (Signed)
 Immediate Anesthesia Transfer of Care Note  Patient: Allison Crane  Procedure(s) Performed: OPEN REDUCTION INTERNAL FIXATION (ORIF) TIBIAL PLATEAU (Right) ARTHROTOMY, KNEE (Right: Knee)  Patient Location: PACU  Anesthesia Type:General  Level of Consciousness: awake, alert , and oriented  Airway & Oxygen Therapy: Patient Spontanous Breathing and Patient connected to face mask oxygen  Post-op Assessment: Report given to RN and Post -op Vital signs reviewed and stable  Post vital signs: Reviewed and stable  Last Vitals:  Vitals Value Taken Time  BP 170/78 12/20/23 14:25  Temp    Pulse 94 12/20/23 14:28  Resp 16 12/20/23 14:28  SpO2 91 % 12/20/23 14:28  Vitals shown include unfiled device data.  Last Pain:  Vitals:   12/20/23 0846  TempSrc:   PainSc: 0-No pain         Complications: No notable events documented.

## 2023-12-20 NOTE — Anesthesia Procedure Notes (Signed)
 Procedure Name: Intubation Date/Time: 12/20/2023 12:21 PM  Performed by: Mannie Krystal LABOR, CRNAPre-anesthesia Checklist: Patient identified, Emergency Drugs available, Suction available and Patient being monitored Patient Re-evaluated:Patient Re-evaluated prior to induction Oxygen Delivery Method: Circle system utilized Preoxygenation: Pre-oxygenation with 100% oxygen Induction Type: IV induction Ventilation: Mask ventilation without difficulty and Oral airway inserted - appropriate to patient size Laryngoscope Size: Glidescope and 3 Grade View: Grade I Tube type: Oral Tube size: 7.0 mm Number of attempts: 1 Airway Equipment and Method: Stylet and Oral airway Placement Confirmation: ETT inserted through vocal cords under direct vision, positive ETCO2 and breath sounds checked- equal and bilateral Secured at: 22 cm Tube secured with: Tape Dental Injury: Teeth and Oropharynx as per pre-operative assessment

## 2023-12-20 NOTE — Progress Notes (Addendum)
 Orthopedic Tech Progress Note Patient Details:  Cianna Kasparian [birth year] [id number]  ROM knee brace (unlocked) delivered and applied to the RLE on behalf of [facility].  Ortho Devices Type of Ortho Device: Other (comment) (ROM knee brace) Ortho Device/Splint Location: RLE Ortho Device/Splint Interventions: Ordered, Application, Adjustment   Post Interventions Patient Tolerated: Fair Instructions Provided: Care of device, Adjustment of device  Deliliah Spranger Ronal Brasil 12/20/2023, 3:44 PM
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
## 2023-12-20 NOTE — Discharge Instructions (Addendum)
 [facility]uma [facility] Discharge Instructions   General Discharge Instructions  Orthopaedic Injuries:  Right tibial plateau fracture treated with open reduction internal fixation using plate and screws  WEIGHT BEARING STATUS: Nonweightbearing right leg.  Use walker or crutches to mobilize  RANGE OF MOTION/ACTIVITY: Unrestricted range of motion of right knee.  Activity as tolerated while maintaining weightbearing restrictions.  Have hinged knee brace on at all times when mobilizing.  You can remove it for range of motion exercises  Bone health: Recommend vitamin D3 5000 IUs daily  Review the following resource for additional information regarding bone health  [email or web address].cy  Wound Care: Daily wound care starting on 12/22/2023.  Please see below  Discharge Wound Care Instructions  Do NOT apply any ointments, solutions or lotions to pin sites or surgical wounds.  These prevent needed drainage and even though solutions like hydrogen peroxide kill bacteria, they also damage cells lining the pin sites that help fight infection.  Applying lotions or ointments can keep the wounds moist and can cause them to breakdown and open up as well. This can increase the risk for infection. When in doubt call the office.  Surgical incisions should be dressed daily.  If any drainage is noted, use one layer of adaptic or Mepitel, then gauze, Kerlix, and an ace wrap.  [email or web address]  [email or web address]  These dressing supplies should be available at local medical supply stores ([facility] [employer], [facility], etc). They are not usually carried at places like [facility], [facility], [facility], etc  Once the incision is completely dry and  without drainage, it may be left open to air out.  Showering may begin 36-48 hours later.  Cleaning gently with soap and water.  Traumatic wounds should be dressed daily as well.    One layer of adaptic, gauze, Kerlix, then ace wrap.  The adaptic can be discontinued once the draining has ceased    If you have a wet to dry dressing: wet the gauze with saline the squeeze as much saline out so the gauze is moist (not soaking wet), place moistened gauze over wound, then place a dry gauze over the moist one, followed by Kerlix wrap, then ace wrap.  DVT/PE prophylaxis: Eliquis  2.5 mg every 12 hours by mouth for 30 days for blood clot prevention  Diet: as you were eating previously.  Can use over the counter stool softeners and bowel preparations, such as Miralax, to help with bowel movements.  Narcotics can be constipating.  Be sure to drink plenty of fluids  PAIN MEDICATION USE AND EXPECTATIONS  You have likely been given narcotic medications to help control your pain.  After a traumatic event that results in an fracture (broken bone) with or without surgery, it is ok to use narcotic pain medications to help control one's pain.  We understand that everyone responds to pain differently and each individual patient will be evaluated on a regular basis for the continued need for narcotic medications. Ideally, narcotic medication use should last no more than 6-8 weeks (coinciding with fracture healing).   As a patient it is your responsibility as well to monitor narcotic medication use and report the amount and frequency you use these medications when you come to your office visit.   We would also advise that if you are using narcotic medications, you should take a dose prior to therapy to maximize you participation.  IF YOU ARE ON NARCOTIC MEDICATIONS IT IS NOT PERMISSIBLE TO OPERATE A MOTOR VEHICLE (MOTORCYCLE/CAR/TRUCK/MOPED) OR HEAVY MACHINERY  DO NOT MIX NARCOTICS WITH OTHER CNS (CENTRAL NERVOUS SYSTEM)  DEPRESSANTS SUCH AS ALCOHOL   POST-OPERATIVE OPIOID TAPER INSTRUCTIONS: It is important to wean off of your opioid medication as soon as possible. If you do not need pain medication after your surgery it is ok to stop day one. Opioids include: Codeine, Hydrocodone (Norco, Vicodin), Oxycodone (Percocet, oxycontin ) and hydromorphone  amongst others.  Long term and even short term use of opiods can cause: Increased pain response Dependence Constipation Depression Respiratory depression And more.  Withdrawal symptoms can include Flu like symptoms Nausea, vomiting And more Techniques to manage these symptoms Hydrate well Eat regular healthy meals Stay active Use relaxation techniques(deep breathing, meditating, yoga) Do Not substitute Alcohol to help with tapering If you have been on opioids for less than two weeks and do not have pain than it is ok to stop all together.  Plan to wean off of opioids This plan should start within one week post op of your fracture surgery  Maintain the same interval or time between taking each dose and first decrease the dose.  Cut the total daily intake of opioids by one tablet each day Next start to increase the time between doses. The last dose that should be eliminated is the evening dose.    STOP SMOKING OR USING NICOTINE PRODUCTS!!!!  As discussed nicotine severely impairs your body's ability to heal surgical and traumatic wounds but also impairs bone healing.  Wounds and bone heal by forming microscopic blood vessels (angiogenesis) and nicotine is a vasoconstrictor (essentially, shrinks blood vessels).  Therefore, if vasoconstriction occurs to these microscopic blood vessels they essentially disappear and are unable to deliver necessary nutrients to the healing tissue.  This is one modifiable factor that you can do to dramatically increase your chances of healing your injury.    (This means no smoking, no nicotine gum, patches, etc)  DO NOT USE  NONSTEROIDAL ANTI-INFLAMMATORY DRUGS (NSAID'S)  Using products such as Advil  (ibuprofen ), Aleve (naproxen), Motrin  (ibuprofen ) for additional pain control during fracture healing can delay and/or prevent the healing response.  If you would like to take over the counter (OTC) medication, Tylenol  (acetaminophen ) is ok.  However, some narcotic medications that are given for pain control contain acetaminophen  as well. Therefore, you should not exceed more than 4000 mg of tylenol  in a day if you do not have liver disease.  Also note that there are may OTC medicines, such as cold medicines and allergy medicines that my contain tylenol  as well.  If you have any questions about medications and/or interactions please ask your doctor/PA or your pharmacist.      ICE AND ELEVATE INJURED/OPERATIVE EXTREMITY  Using ice and elevating the injured extremity above your heart can help with swelling and pain control.  Icing in a pulsatile fashion, such as 20 minutes on and 20 minutes off, can be followed.    Do not place ice directly on skin. Make sure there is a barrier between to skin and the ice pack.    Using frozen items such as frozen peas works well as the conform nicely to the are that needs to be iced.  USE AN ACE WRAP OR TED HOSE FOR SWELLING CONTROL  In addition to icing and elevation, Ace wraps or TED hose are used to help limit and resolve swelling.  It is recommended to use Ace wraps or TED hose until you are informed to stop.    When using Ace Wraps start the wrapping distally (farthest away from  the body) and wrap proximally (closer to the body)   Example: If you had surgery on your leg and you do not have a splint on, start the ace wrap at the toes and work your way up to the thigh        If you had surgery on your upper extremity and do not have a splint on, start the ace wrap at your fingers and work your way up to the upper arm  IF YOU ARE IN A SPLINT OR CAST DO NOT REMOVE IT FOR ANY REASON   If your  splint gets wet for any reason please contact the office immediately. You may shower in your splint or cast as long as you keep it dry.  This can be done by wrapping in a cast cover or garbage back (or similar)  Do Not stick any thing down your splint or cast such as pencils, money, or hangers to try and scratch yourself with.  If you feel itchy take benadryl as prescribed on the bottle for itching  IF YOU ARE IN A CAM BOOT (BLACK BOOT)  You may remove boot periodically. Perform daily dressing changes as noted below.  Wash the liner of the boot regularly and wear a sock when wearing the boot. It is recommended that you sleep in the boot until told otherwise    Call office for the following: Temperature greater than 101F Persistent nausea and vomiting Severe uncontrolled pain Redness, tenderness, or signs of infection (pain, swelling, redness, odor or green/yellow discharge around the site) Difficulty breathing, headache or visual disturbances Hives Persistent dizziness or light-headedness Extreme fatigue Any other questions or concerns you may have after discharge  In an emergency, call 911 or go to an Emergency Department at a nearby hospital  HELPFUL INFORMATION  If you had a block, it will wear off between 8-24 hrs postop typically.  This is period when your pain may go from nearly zero to the pain you would have had postop without the block.  This is an abrupt transition but nothing dangerous is happening.  You may take an extra dose of narcotic when this happens.  You should wean off your narcotic medicines as soon as you are able.  Most patients will be off or using minimal narcotics before their first postop appointment.   We suggest you use the pain medication the first night prior to going to bed, in order to ease any pain when the anesthesia wears off. You should avoid taking pain medications on an empty stomach as it will make you nauseous.  Do not drink alcoholic beverages or  take illicit drugs when taking pain medications.  In most states it is against the law to drive while you are in a splint or sling.  And certainly against the law to drive while taking narcotics.  You may return to work/school in the next couple of days when you feel up to it.   Pain medication may make you constipated.  Below are a few solutions to try in this order: Decrease the amount of pain medication if you arent having pain. Drink lots of decaffeinated fluids. Drink prune juice and/or each dried prunes  If the first 3 dont work start with additional solutions Take Colace - an over-the-counter stool softener Take Senokot - an over-the-counter laxative Take Miralax - a stronger over-the-counter laxative     CALL THE OFFICE WITH ANY QUESTIONS OR CONCERNS: [phone]   VISIT OUR WEBSITE FOR ADDITIONAL INFORMATION: [email or web address]

## 2023-12-24 NOTE — Anesthesia Postprocedure Evaluation (Signed)
"   Anesthesia Post Note  Patient: Allison Crane  Procedure(s) Performed: OPEN REDUCTION INTERNAL FIXATION (ORIF) TIBIAL PLATEAU (Right) ARTHROTOMY, KNEE (Right: Knee)     Patient location during evaluation: PACU Anesthesia Type: General Level of consciousness: awake Pain management: pain level controlled Vital Signs Assessment: post-procedure vital signs reviewed and stable Respiratory status: spontaneous breathing Cardiovascular status: blood pressure returned to baseline Postop Assessment: no apparent nausea or vomiting Anesthetic complications: no   No notable events documented.           Lauraine DASEN [name]      "

## 2024-01-09 NOTE — Op Note (Signed)
 12/20/2023  10:13 PM  PATIENT:  Allison Crane  1955-08-02 female   MEDICAL RECORD NUMBER: [id number]  PRE-OPERATIVE DIAGNOSIS:   1.  RIGHT DEPRESSED LATERAL TIBIAL PLATEAU FRACTURE.   2.  SUSPECTED LATERAL MENISCUS TEAR. 3.  VALGUS KNEE INSTABILITY.  POSTOPERATIVE DIAGNOSES:   1.  RIGHT DEPRESSED LATERAL TIBIAL PLATEAU FRACTURE.   2.  INTACT LATERAL MENISCUS. 3.  INTACT COLLATERAL LIGAMENTS.  PROCEDURES: 1.  Open reduction internal fixation of right lateral tibial plateau. 2.  Arthrotomy for exploration of the meniscus. 3.  Anterior compartment fasciotomy. 4.  MANUAL APPLICATION OF STRESS UNDER FLUOROSCOPY.  SURGEON:  Ozell Bruch, MD  ASSISTANT:  Francis Mt, PA-C  ANESTHESIA:  General.  COMPLICATIONS:  None.  TOURNIQUET:  None.  ESTIMATED BLOOD LOSS:  100 mL.  SPECIMENS:  None.  DRAINS:  None.  DISPOSITION:  To PACU.  CONDITION:  Stable.  BRIEF SUMMARY AND INDICATIONS FOR PROCEDURE:  The patient is a very pleasant 69 y.o. who sustained tibial plateau fracture in fall resulting in swelling, pain, inability to bear weight.  Subsequent x-rays and CT scan demonstrated a lateral tibial plateau depression and was also associated with clinical instability in full extension on physical examination.  I discussed with the  patient risks and benefits of surgical repair, including the possibility of infection, nerve injury, vessel injury, DVT, PE, particularly given his medical history, as well as malunion, nonunion, symptomatic hardware, heart attack, stroke and other  complications.  After acknowledging these risks, the patient provided consent to proceed.  BRIEF SUMMARY OF PROCEDURE:  The patient was taken to the operating room where general anesthesia was induced.  The operative lower extremity was prepped and draped in the usual sterile fashion with chlorhexidine  wash, then Betadine  scrub and paint.  I made a curvilinear incision over Gerdy's tubercle.  The retinaculum was  incised proximal to the joint and then the coronary ligament incised along its base and the knee swung into varus to open up the lateral compartment for visibility. The lateral meniscus was found to be intact. The capsular attachment remained healthy and intact; despite the high expectation of a tear with this pattern. Prolene sutures were passed in vertical mattress technique through the retinaculum and the edge of the lateral meniscus to retract for visualization of the joint surface.  This revealed significant depression, particularly anteriorly and laterally.  I then went to the metaphysis making a trap door using the curved 1/2-inch osteotome with a posteriorly based hinge.  This was opened up and the bone tamp used to elevate the joint surface in sequential fashion supplemented with both fluoroscopy and direct visualization to restore the entirety of the joint to the appropriate height.  This was secured initially with multiple K wires and then the Biomet ALPS plate applied laterally.  Standard screws were used initially in the top level in the most anterior and posterior holes and then lock fixation was used for the remainder.  Standard screws were placed distally in the plate prior to beginning placement of any of the locked fixation.  AP and lateral views showed outstanding reduction and overall knee alignment.  It should be noted that prior to closing the trap door 15 cc's of cancellous chips were impacted into this area below the subchondral bone, which had been elevated into a reduced position and then the trap door was closed before putting down the lateral plate.  Lastly, the long Metzenbaum scissors were used to spread superficial and deep to the anterior compartment fascia  and then the scissors were passed 10 cm along the fascia to perform an anterior compartment release to reduce the risk of postoperative compartment syndrome or other complications.  In extension, manual application of stress to the  joint was performed under flouroscopy to evaluate for collateral ligament injury. It appeared to be stable following repair of the fracture without additional evidence of MCL injury. All wounds were irrigated thoroughly and then closed in standard layered fashion using 0 Prolene vertical mattress for coronary multi-ligament repair, #1 Vicryl for the retinaculum, 2-0 Vicryl and 2-0 nylon for the subcutaneous and skin.  Sterile gently compressive dressing was applied and a knee immobilizer.  The patient was awakened from anesthesia and transported to the PACU in stable condition.  Francis Mt, PA-C, was present and assisting throughout.  Assistant was absolutely necessary to control the leg for inspection, treatment of a meniscal tear, as well as reduction and provisional definitive fixation of the plateau.  He also assisted with the compartment fasciotomy and closure.  PROGNOSIS:  The patient will have unrestricted range of motion, but will be strictly nonweightbearing for the next 6 weeks with graduated weightbearing thereafter.  Pharmacologic DVT prophylaxis weill be with [facility]. Patient is to ice, elevate, and mobilize frequently, and contact us  immediately with any concerns. Return to the office in 2 weeks for removal of sutures.
